# Patient Record
Sex: Female | Born: 1985 | Race: Black or African American | Hispanic: No | Marital: Married | Smoking: Never smoker
Health system: Southern US, Community
[De-identification: ages and names within clinical notes are randomized; demographics above are authoritative.]

## PROBLEM LIST (undated history)

## (undated) ENCOUNTER — Inpatient Hospital Stay (HOSPITAL_COMMUNITY): Payer: Self-pay

## (undated) DIAGNOSIS — K219 Gastro-esophageal reflux disease without esophagitis: Secondary | ICD-10-CM

## (undated) DIAGNOSIS — G43909 Migraine, unspecified, not intractable, without status migrainosus: Secondary | ICD-10-CM

## (undated) DIAGNOSIS — Z8489 Family history of other specified conditions: Secondary | ICD-10-CM

## (undated) DIAGNOSIS — D219 Benign neoplasm of connective and other soft tissue, unspecified: Secondary | ICD-10-CM

## (undated) DIAGNOSIS — R87629 Unspecified abnormal cytological findings in specimens from vagina: Secondary | ICD-10-CM

## (undated) DIAGNOSIS — L309 Dermatitis, unspecified: Secondary | ICD-10-CM

## (undated) DIAGNOSIS — F32A Depression, unspecified: Secondary | ICD-10-CM

## (undated) HISTORY — DX: Dermatitis, unspecified: L30.9

## (undated) HISTORY — DX: Depression, unspecified: F32.A

## (undated) HISTORY — DX: Unspecified abnormal cytological findings in specimens from vagina: R87.629

## (undated) HISTORY — PX: WISDOM TOOTH EXTRACTION: SHX21

---

## 2005-03-19 ENCOUNTER — Other Ambulatory Visit: Admission: RE | Admit: 2005-03-19 | Discharge: 2005-03-19 | Payer: Self-pay | Admitting: Family Medicine

## 2006-08-09 ENCOUNTER — Other Ambulatory Visit: Admission: RE | Admit: 2006-08-09 | Discharge: 2006-08-09 | Payer: Self-pay | Admitting: Family Medicine

## 2007-02-10 ENCOUNTER — Other Ambulatory Visit: Admission: RE | Admit: 2007-02-10 | Discharge: 2007-02-10 | Payer: Self-pay | Admitting: Family Medicine

## 2007-08-15 ENCOUNTER — Other Ambulatory Visit: Admission: RE | Admit: 2007-08-15 | Discharge: 2007-08-15 | Payer: Self-pay | Admitting: Family Medicine

## 2007-12-12 ENCOUNTER — Ambulatory Visit (HOSPITAL_COMMUNITY): Admission: RE | Admit: 2007-12-12 | Discharge: 2007-12-12 | Payer: Self-pay | Admitting: Oral Surgery

## 2008-08-16 ENCOUNTER — Other Ambulatory Visit: Admission: RE | Admit: 2008-08-16 | Discharge: 2008-08-16 | Payer: Self-pay | Admitting: Family Medicine

## 2009-02-26 ENCOUNTER — Other Ambulatory Visit: Admission: RE | Admit: 2009-02-26 | Discharge: 2009-02-26 | Payer: Self-pay | Admitting: Family Medicine

## 2009-10-31 ENCOUNTER — Other Ambulatory Visit: Admission: RE | Admit: 2009-10-31 | Discharge: 2009-10-31 | Payer: Self-pay | Admitting: Obstetrics and Gynecology

## 2010-04-30 ENCOUNTER — Other Ambulatory Visit: Admission: RE | Admit: 2010-04-30 | Discharge: 2010-04-30 | Payer: Self-pay | Admitting: Obstetrics and Gynecology

## 2010-10-05 ENCOUNTER — Encounter
Admission: RE | Admit: 2010-10-05 | Discharge: 2010-10-05 | Payer: Self-pay | Source: Home / Self Care | Attending: Family Medicine | Admitting: Family Medicine

## 2010-10-08 ENCOUNTER — Encounter
Admission: RE | Admit: 2010-10-08 | Discharge: 2010-10-08 | Payer: Self-pay | Source: Home / Self Care | Attending: Family Medicine | Admitting: Family Medicine

## 2011-03-05 ENCOUNTER — Other Ambulatory Visit: Payer: Self-pay | Admitting: Obstetrics and Gynecology

## 2011-03-05 ENCOUNTER — Other Ambulatory Visit (HOSPITAL_COMMUNITY)
Admission: RE | Admit: 2011-03-05 | Discharge: 2011-03-05 | Disposition: A | Discharge status: Home / Self Care | Payer: Self-pay | Source: Ambulatory Visit | Attending: Obstetrics and Gynecology | Admitting: Obstetrics and Gynecology

## 2011-03-05 DIAGNOSIS — Z01419 Encounter for gynecological examination (general) (routine) without abnormal findings: Secondary | ICD-10-CM | POA: Insufficient documentation

## 2012-03-08 ENCOUNTER — Other Ambulatory Visit: Payer: Self-pay | Admitting: Obstetrics and Gynecology

## 2012-03-08 ENCOUNTER — Other Ambulatory Visit (HOSPITAL_COMMUNITY)
Admission: RE | Admit: 2012-03-08 | Discharge: 2012-03-08 | Disposition: A | Discharge status: Home / Self Care | Payer: 59 | Source: Ambulatory Visit | Attending: Obstetrics and Gynecology | Admitting: Obstetrics and Gynecology

## 2012-03-08 DIAGNOSIS — Z01419 Encounter for gynecological examination (general) (routine) without abnormal findings: Secondary | ICD-10-CM | POA: Insufficient documentation

## 2012-03-08 DIAGNOSIS — Z113 Encounter for screening for infections with a predominantly sexual mode of transmission: Secondary | ICD-10-CM | POA: Insufficient documentation

## 2013-03-14 ENCOUNTER — Other Ambulatory Visit (HOSPITAL_COMMUNITY)
Admission: RE | Admit: 2013-03-14 | Discharge: 2013-03-14 | Disposition: A | Discharge status: Home / Self Care | Payer: 59 | Source: Ambulatory Visit | Attending: Obstetrics and Gynecology | Admitting: Obstetrics and Gynecology

## 2013-03-14 ENCOUNTER — Other Ambulatory Visit: Payer: Self-pay | Admitting: Obstetrics and Gynecology

## 2013-03-14 DIAGNOSIS — Z01419 Encounter for gynecological examination (general) (routine) without abnormal findings: Secondary | ICD-10-CM | POA: Insufficient documentation

## 2013-06-28 ENCOUNTER — Emergency Department (HOSPITAL_COMMUNITY)
Admission: EM | Admit: 2013-06-28 | Discharge: 2013-06-28 | Disposition: A | Discharge status: Home / Self Care | Payer: 59 | Attending: Emergency Medicine | Admitting: Emergency Medicine

## 2013-06-28 ENCOUNTER — Encounter (HOSPITAL_COMMUNITY): Payer: Self-pay

## 2013-06-28 DIAGNOSIS — G43909 Migraine, unspecified, not intractable, without status migrainosus: Secondary | ICD-10-CM

## 2013-06-28 DIAGNOSIS — IMO0002 Reserved for concepts with insufficient information to code with codable children: Secondary | ICD-10-CM | POA: Insufficient documentation

## 2013-06-28 DIAGNOSIS — R209 Unspecified disturbances of skin sensation: Secondary | ICD-10-CM | POA: Insufficient documentation

## 2013-06-28 DIAGNOSIS — Z3202 Encounter for pregnancy test, result negative: Secondary | ICD-10-CM | POA: Insufficient documentation

## 2013-06-28 DIAGNOSIS — Z79899 Other long term (current) drug therapy: Secondary | ICD-10-CM | POA: Insufficient documentation

## 2013-06-28 HISTORY — DX: Migraine, unspecified, not intractable, without status migrainosus: G43.909

## 2013-06-28 MED ORDER — METOCLOPRAMIDE HCL 5 MG/ML IJ SOLN
10.0000 mg | Freq: Once | INTRAMUSCULAR | Status: AC
  Administered 2013-06-28: 10 mg via INTRAVENOUS
  Filled 2013-06-28: qty 2

## 2013-06-28 MED ORDER — DIPHENHYDRAMINE HCL 50 MG/ML IJ SOLN
25.0000 mg | Freq: Once | INTRAMUSCULAR | Status: AC
  Administered 2013-06-28: 25 mg via INTRAVENOUS
  Filled 2013-06-28: qty 1

## 2013-06-28 MED ORDER — DEXAMETHASONE SODIUM PHOSPHATE 10 MG/ML IJ SOLN
10.0000 mg | Freq: Once | INTRAMUSCULAR | Status: AC
  Administered 2013-06-28: 10 mg via INTRAVENOUS
  Filled 2013-06-28: qty 1

## 2013-06-28 MED ORDER — SODIUM CHLORIDE 0.9 % IV BOLUS (SEPSIS)
1000.0000 mL | Freq: Once | INTRAVENOUS | Status: AC
  Administered 2013-06-28: 1000 mL via INTRAVENOUS

## 2013-06-28 NOTE — ED Provider Notes (Signed)
CSN: [id number]     Arrival date & time 06/28/13  0912 History   First MD Initiated Contact with Patient 06/28/13 1056     Chief Complaint  Patient presents with  . Migraine   (Consider location/radiation/quality/duration/timing/severity/associated sxs/prior Treatment) Patient is a 27 y.o. female presenting with migraines.  Migraine This is a new problem. The current episode started more than 1 week ago (3 days). The problem occurs constantly. The problem has been gradually worsening. Associated symptoms include headaches. Pertinent negatives include no chest pain, no abdominal pain and no shortness of breath. Exacerbated by: light. Nothing relieves the symptoms. Treatments tried: replex, flexeril, topamax. The treatment provided no relief.    Past Medical History  Diagnosis Date  . Migraine    History reviewed. No pertinent past surgical history. History reviewed. No pertinent family history. History  Substance Use Topics  . Smoking status: Never Smoker   . Smokeless tobacco: Never Used  . Alcohol Use: No   OB History   Grav Para Term Preterm Abortions TAB SAB Ect Mult Living                 Review of Systems  Constitutional: Negative for fever, chills, diaphoresis, activity change, appetite change and fatigue.  HENT: Negative for congestion, sore throat, facial swelling, rhinorrhea, neck pain and neck stiffness.   Eyes: Negative for photophobia and discharge.  Respiratory: Negative for cough, chest tightness and shortness of breath.   Cardiovascular: Negative for chest pain, palpitations and leg swelling.  Gastrointestinal: Negative for nausea, vomiting, abdominal pain and diarrhea.  Endocrine: Negative for polydipsia and polyuria.  Genitourinary: Negative for dysuria, frequency, difficulty urinating and pelvic pain.  Musculoskeletal: Negative for back pain and arthralgias.  Skin: Negative for color change and wound.  Allergic/Immunologic: Negative for immunocompromised  state.  Neurological: Positive for headaches. Negative for facial asymmetry, weakness and numbness.  Hematological: Does not bruise/bleed easily.  Psychiatric/Behavioral: Negative for confusion and agitation.    Allergies  Review of patient's allergies indicates no known allergies.  Home Medications   Current Outpatient Rx  Name  Route  Sig  Dispense  Refill  . cyclobenzaprine (FLEXERIL) 10 MG tablet   Oral   Take 10 mg by mouth 3 (three) times daily as needed (migraines).         . eletriptan (RELPAX) 40 MG tablet   Oral   Take 40 mg by mouth as needed for migraine. One tablet by mouth at onset of headache. May repeat in 2 hours if headache persists or recurs.         . norgestimate-ethinyl estradiol (ORTHO-CYCLEN,SPRINTEC,PREVIFEM) 0.25-35 MG-MCG tablet   Oral   Take 1 tablet by mouth daily.         Marland Kitchen topiramate (TOPAMAX) 50 MG tablet   Oral   Take 100 mg by mouth at bedtime.         . triamcinolone ointment (KENALOG) 0.1 %   Topical   Apply 1 application topically 2 (two) times daily as needed (eczema).          BP 125/83  Pulse 97  Temp(Src) 98.4 F (36.9 C) (Oral)  Resp 16  Ht 5\' 1"  (1.549 m)  Wt 144 lb 6 oz (65.488 kg)  BMI 27.29 kg/m2  SpO2 100%  LMP 03/28/2013 Physical Exam  Constitutional: She is oriented to person, place, and time. She appears well-developed and well-nourished. No distress.  HENT:  Head: Normocephalic and atraumatic.  Mouth/Throat: No oropharyngeal exudate.  Eyes: Pupils  are equal, round, and reactive to light.  Neck: Normal range of motion. Neck supple.  Cardiovascular: Normal rate, regular rhythm and normal heart sounds.  Exam reveals no gallop and no friction rub.   No murmur heard. Pulmonary/Chest: Effort normal and breath sounds normal. No respiratory distress. She has no wheezes. She has no rales.  Abdominal: Soft. Bowel sounds are normal. She exhibits no distension and no mass. There is no tenderness. There is no  rebound and no guarding.  Musculoskeletal: Normal range of motion. She exhibits no edema and no tenderness.  Neurological: She is alert and oriented to person, place, and time. She has normal strength. She displays no tremor. No cranial nerve deficit or sensory deficit. She exhibits normal muscle tone. She displays a negative Romberg sign. Coordination and gait normal. GCS eye subscore is 4. GCS verbal subscore is 5. GCS motor subscore is 6.  Skin: Skin is warm and dry.  Psychiatric: She has a normal mood and affect.    ED Course  Procedures (including critical care time) Labs Review Labs Reviewed  POCT PREGNANCY, URINE   Imaging Review No results found.  MDM   1. Migraine    Pt is a 27 y.o. female with Pmhx as above who presents with constant, L sided migraine w/ assoc nausea, blurry vision, photophobia and paresthesias L hand.  Symptoms similar to prior symptoms, has tired home meds w/o relief.  VSS, tp in NDA on PE, no focal neuro findings.  Doubt meningitis, SAH.  Migraine cocktail given which improved symptoms.  I feel tp safe for d/c.  Return precautions given for new or worsening symptoms including worsening h/a, fever, focal neuro symptoms.   1. Migraine         Shanna Cisco, MD 06/28/13 1300

## 2013-06-28 NOTE — ED Notes (Signed)
Patient reports having a migraine x 3 days. Patient c/o slight photophobia and intense sound increases pain. Patient c/o blurred vision when she woke this AM. Patient states she has taken the meds prescribed for her migraines with no relief.

## 2013-06-28 NOTE — Progress Notes (Signed)
[facility] ED CM noted pt with coverage but no pcp listed Spoke with pt who confirms no pcp States previously saw a pcp at [facility] but the MD retired in august 2014  [facility] ED CM spoke with pt on how to obtain an in network pcp with insurance coverage via the customer service number or web site  CM encouraged pt and discussed pt's responsibility to verify with pt's insurance carrier that any recommended medical provider offered by any emergency room or a hospital provider is within the carrier's network. The pt voiced understanding

## 2013-06-28 NOTE — ED Notes (Signed)
Pharmacist at bedside.

## 2013-11-13 ENCOUNTER — Other Ambulatory Visit: Payer: Self-pay | Admitting: Family Medicine

## 2013-11-13 DIAGNOSIS — R109 Unspecified abdominal pain: Secondary | ICD-10-CM

## 2013-11-16 ENCOUNTER — Ambulatory Visit
Admission: RE | Admit: 2013-11-16 | Discharge: 2013-11-16 | Disposition: A | Discharge status: Home / Self Care | Payer: 59 | Source: Ambulatory Visit | Attending: Family Medicine | Admitting: Family Medicine

## 2013-11-16 DIAGNOSIS — R109 Unspecified abdominal pain: Secondary | ICD-10-CM

## 2013-11-20 ENCOUNTER — Encounter (INDEPENDENT_AMBULATORY_CARE_PROVIDER_SITE_OTHER): Payer: Self-pay | Admitting: Surgery

## 2013-11-27 ENCOUNTER — Telehealth (INDEPENDENT_AMBULATORY_CARE_PROVIDER_SITE_OTHER): Payer: Self-pay | Admitting: Surgery

## 2013-11-27 ENCOUNTER — Encounter (INDEPENDENT_AMBULATORY_CARE_PROVIDER_SITE_OTHER): Payer: Self-pay | Admitting: Surgery

## 2013-11-27 ENCOUNTER — Ambulatory Visit (INDEPENDENT_AMBULATORY_CARE_PROVIDER_SITE_OTHER): Payer: 59 | Admitting: Surgery

## 2013-11-27 VITALS — BP 122/79 | HR 82 | Temp 98.2°F | Resp 12 | Ht 61.0 in | Wt 138.2 lb

## 2013-11-27 DIAGNOSIS — K8010 Calculus of gallbladder with chronic cholecystitis without obstruction: Secondary | ICD-10-CM

## 2013-11-27 NOTE — Progress Notes (Signed)
Patient ID: Karen Berg, female   DOB: 23-Jul-1986, 28 y.o.   MRN: [id number]  Chief Complaint  Patient presents with  . Abdominal Pain    gallstones    HPI Karen Berg is a 28 y.o. female.  Referred by Dr. Maurice Small for symptomatic gallbladder disease  Abdominal Pain Associated symptoms: no chest pain, no chills, no constipation, no cough, no diarrhea, no fever, no hematuria, no nausea, no sore throat, no vaginal bleeding and no vomiting    This is a healthy 28 year old female who presents with 3 recent episodes of severe epigastric abdominal pain that radiated through to her back. These episodes are associated with nausea but no vomiting. She denies any abdominal bloating or diarrhea. Her symptoms seem to be related to eating. She underwent a workup including blood work and an ultrasound. Ultrasound showed gallstones. Liver function test are normal.   Past Medical History  Diagnosis Date  . Migraine   . Eczema     History reviewed. No pertinent past surgical history.  History reviewed. No pertinent family history.  Social History History  Substance Use Topics  . Smoking status: Never Smoker   . Smokeless tobacco: Never Used  . Alcohol Use: Yes     Comment: rarely    No Known Allergies  Current Outpatient Prescriptions  Medication Sig Dispense Refill  . cyclobenzaprine (FLEXERIL) 10 MG tablet Take 10 mg by mouth 3 (three) times daily as needed (migraines).      . eletriptan (RELPAX) 40 MG tablet Take 40 mg by mouth as needed for migraine. One tablet by mouth at onset of headache. May repeat in 2 hours if headache persists or recurs.      Marland Kitchen ibuprofen (ADVIL,MOTRIN) 800 MG tablet Take 800 mg by mouth every 8 (eight) hours as needed.      . norgestimate-ethinyl estradiol (ORTHO-CYCLEN,SPRINTEC,PREVIFEM) 0.25-35 MG-MCG tablet Take 1 tablet by mouth daily.      Marland Kitchen topiramate (TOPAMAX) 50 MG tablet Take 100 mg by mouth at bedtime.      . triamcinolone ointment (KENALOG)  0.1 % Apply 1 application topically 2 (two) times daily as needed (eczema).       No current facility-administered medications for this visit.    Review of Systems Review of Systems  Constitutional: Negative for fever, chills and unexpected weight change.  HENT: Negative for congestion, hearing loss, sore throat, trouble swallowing and voice change.   Eyes: Negative for visual disturbance.  Respiratory: Negative for cough and wheezing.   Cardiovascular: Negative for chest pain, palpitations and leg swelling.  Gastrointestinal: Positive for abdominal pain. Negative for nausea, vomiting, diarrhea, constipation, blood in stool, abdominal distention and anal bleeding.  Genitourinary: Negative for hematuria, vaginal bleeding and difficulty urinating.  Musculoskeletal: Negative for arthralgias.  Skin: Negative for rash and wound.  Neurological: Negative for seizures, syncope and headaches.  Hematological: Negative for adenopathy. Does not bruise/bleed easily.  Psychiatric/Behavioral: Negative for confusion.    Blood pressure 122/79, pulse 82, temperature 98.2 F (36.8 C), temperature source Temporal, resp. rate 12, height 5\' 1"  (1.549 m), weight 138 lb 3.2 oz (62.687 kg).  Physical Exam Physical Exam WDWN in NAD HEENT:  EOMI, sclera anicteric Neck:  No masses, no thyromegaly Lungs:  CTA bilaterally; normal respiratory effort CV:  Regular rate and rhythm; no murmurs Abd:  +bowel sounds, soft, mild epigastric/ RUQ tenderness, no masses Ext:  Well-perfused; no edema Skin:  Warm, dry; no sign of jaundice  Data Reviewed US Abdomen Complete  11/16/2013  CLINICAL DATA:  Abdominal pain  EXAM: ULTRASOUND ABDOMEN COMPLETE  COMPARISON:  None.  FINDINGS: Gallbladder:  Small echogenic stones, mobile. No wall thickening. No pericholecystic fluid.  Common bile duct:  Diameter: 4.1 mm.  No duct stone is seen.  Liver:  No focal lesion identified. Within normal limits in parenchymal echogenicity.  IVC:   No abnormality visualized.  Pancreas:  Visualized portion unremarkable.  Spleen:  Size and appearance within normal limits.  Right Kidney:  Length: 10.7 cm. Echogenicity within normal limits. No mass or hydronephrosis visualized.  Left Kidney:  Length: 10.2 cm. Echogenicity within normal limits. No mass or hydronephrosis visualized.  Abdominal aorta:  No aneurysm visualized.  Other findings:  None.  IMPRESSION: Cholelithiasis.  No evidence acute cholecystitis.  No other abnormalities.   Electronically Signed   By: Lajean Manes M.D.   On: 11/16/2013 13:28      Assessment    Chronic calculus cholecystitis.     Plan    Laparoscopic cholecystectomy with intraoperative cholangiogram.  The surgical procedure has been discussed with the patient.  Potential risks, benefits, alternative treatments, and expected outcomes have been explained.  All of the patient's questions at this time have been answered.  The likelihood of reaching the patient's treatment goal is good.  The patient understand the proposed surgical procedure and wishes to proceed.    [name]. Georgette Dover, MD, [facility] Surgery  General/ Trauma Surgery  11/27/2013 2:18 PM        Parley Pidcock K. 11/27/2013, 2:14 PM

## 2013-11-27 NOTE — Telephone Encounter (Signed)
Pt made aware of [facility] financial obligation will call back to schedule  °

## 2014-01-21 ENCOUNTER — Encounter (HOSPITAL_COMMUNITY): Payer: Self-pay | Admitting: Pharmacy Technician

## 2014-01-28 ENCOUNTER — Encounter (HOSPITAL_COMMUNITY): Payer: Self-pay

## 2014-01-28 ENCOUNTER — Encounter (HOSPITAL_COMMUNITY)
Admission: RE | Admit: 2014-01-28 | Discharge: 2014-01-28 | Disposition: A | Discharge status: Home / Self Care | Payer: 59 | Source: Ambulatory Visit | Attending: Surgery | Admitting: Surgery

## 2014-01-28 DIAGNOSIS — Z01812 Encounter for preprocedural laboratory examination: Secondary | ICD-10-CM | POA: Insufficient documentation

## 2014-01-28 HISTORY — DX: Gastro-esophageal reflux disease without esophagitis: K21.9

## 2014-01-28 HISTORY — DX: Family history of other specified conditions: Z84.89

## 2014-01-28 LAB — BASIC METABOLIC PANEL
BUN: 10 mg/dL (ref 6–23)
CO2: 18 meq/L — AB (ref 19–32)
CREATININE: 0.92 mg/dL (ref 0.50–1.10)
Calcium: 9.1 mg/dL (ref 8.4–10.5)
Chloride: 106 mEq/L (ref 96–112)
GFR calc Af Amer: >90 mL/min (ref >90)
GFR calc non Af Amer: 83 mL/min — ABNORMAL LOW (ref >90)
Glucose, Bld: 87 mg/dL (ref 70–99)
Potassium: 3.8 mEq/L (ref 3.7–5.3)
SODIUM: 139 meq/L (ref 137–147)

## 2014-01-28 LAB — CBC
HCT: 34.1 % — ABNORMAL LOW (ref 36.0–46.0)
Hemoglobin: 11.9 g/dL — ABNORMAL LOW (ref 12.0–15.0)
MCH: 31.6 pg (ref 26.0–34.0)
MCHC: 34.9 g/dL (ref 30.0–36.0)
MCV: 90.5 fL (ref 78.0–100.0)
PLATELETS: 280 10*3/uL (ref 150–400)
RBC: 3.77 MIL/uL — AB (ref 3.87–5.11)
RDW: 12.8 % (ref 11.5–15.5)
WBC: 6.2 10*3/uL (ref 4.0–10.5)

## 2014-01-28 LAB — HCG, SERUM, QUALITATIVE: PREG SERUM: NEGATIVE

## 2014-01-28 NOTE — Pre-Procedure Instructions (Addendum)
Karen Berg  01/28/2014   Your procedure is scheduled on: Tuesday, February 05, 2014 at 7:30 AM  Report to [facility] (use Main Entrance "A'') at  5:30 AM.  Call this number if you have problems the morning of surgery: [phone]   Remember:   Do not eat food or drink liquids after midnight, Monday, February 04, 2014   Take these medicines the morning of surgery with A SIP OF WATER:  omeprazole (PRILOSEC)  If needed: SUMAtriptan (IMITREX) 20 MG/ACT nasal spray for migraine or headache.  Stop taking Aspirin and herbal medications. Do not take any NSAIDs ie: Ibuprofen, Advil, Naproxen or any medication containing Aspirin (flurbiprofen (ANSAID) 100 MG tablet). Stop 5 days prior to procedure, Thursday, 01/31/14   Do not wear jewelry, make-up or nail polish.  Do not wear lotions, powders, or perfumes. You may  NOT wear deodorant.  Do not shave 48 hours prior to surgery.  Do not bring valuables to the hospital.  [facility] is not responsible for any belongings or valuables.               Contacts, dentures or bridgework may not be worn into surgery.  Leave suitcase in the car. After surgery it may be brought to your room.  For patients admitted to the hospital, discharge time is determined by your treatment team.               Patients discharged the day of surgery will not be allowed to drive home.  Name and phone number of your driver:   Special Instructions:  Special Instructions:Special Instructions: [facility] - Preparing for Surgery  Before surgery, you can play an important role.  Because skin is not sterile, your skin needs to be as free of germs as possible.  You can reduce the number of germs on you skin by washing with CHG (chlorahexidine gluconate) soap before surgery.  CHG is an antiseptic cleaner which kills germs and bonds with the skin to continue killing germs even after washing.  Please DO NOT use if you have an allergy to CHG or antibacterial soaps.  If your  skin becomes reddened/irritated stop using the CHG and inform your nurse when you arrive at Short Stay.  Do not shave (including legs and underarms) for at least 48 hours prior to the first CHG shower.  You may shave your face.  Please follow these instructions carefully:   1.  Shower with CHG Soap the night before surgery and the morning of Surgery.  2.  If you choose to wash your hair, wash your hair first as usual with your normal shampoo.  3.  After you shampoo, rinse your hair and body thoroughly to remove the Shampoo.  4.  Use CHG as you would any other liquid soap.  You can apply chg directly  to the skin and wash gently with scrungie or a clean washcloth.  5.  Apply the CHG Soap to your body ONLY FROM THE NECK DOWN.  Do not use on open wounds or open sores.  Avoid contact with your eyes, ears, mouth and genitals (private parts).  Wash genitals (private parts) with your normal soap.  6.  Wash thoroughly, paying special attention to the area where your surgery will be performed.  7.  Thoroughly rinse your body with warm water from the neck down.  8.  DO NOT shower/wash with your normal soap after using and rinsing off the CHG Soap.  9.  Fraser Din  yourself dry with a clean towel.            10.  Wear clean pajamas.            11.  Place clean sheets on your bed the night of your first shower and do not sleep with pets.  Day of Surgery  Do not apply any lotions/deodorants the morning of surgery.  Please wear clean clothes to the hospital/surgery center.   Please read over the following fact sheets that you were given: Pain Booklet, Coughing and Deep Breathing and Surgical Site Infection Prevention

## 2014-01-30 NOTE — Progress Notes (Signed)
Message left for medical records at Dr Oran Rein to sent over last office visit and most recent EKG.

## 2014-02-04 MED ORDER — CEFAZOLIN SODIUM-DEXTROSE 2-3 GM-% IV SOLR
2.0000 g | INTRAVENOUS | Status: AC
  Administered 2014-02-05: 2 g via INTRAVENOUS
  Filled 2014-02-04: qty 50

## 2014-02-05 ENCOUNTER — Ambulatory Visit (HOSPITAL_COMMUNITY)
Admission: RE | Admit: 2014-02-05 | Discharge: 2014-02-05 | Disposition: A | Discharge status: Home / Self Care | Payer: 59 | Source: Ambulatory Visit | Attending: Surgery | Admitting: Surgery

## 2014-02-05 ENCOUNTER — Encounter (HOSPITAL_COMMUNITY): Payer: Self-pay | Admitting: Anesthesiology

## 2014-02-05 ENCOUNTER — Ambulatory Visit (HOSPITAL_COMMUNITY): Payer: 59 | Admitting: Anesthesiology

## 2014-02-05 ENCOUNTER — Ambulatory Visit (HOSPITAL_COMMUNITY): Payer: 59

## 2014-02-05 ENCOUNTER — Encounter (HOSPITAL_COMMUNITY)
Admission: RE | Disposition: A | Discharge status: Home / Self Care | Payer: Self-pay | Source: Ambulatory Visit | Attending: Surgery

## 2014-02-05 ENCOUNTER — Encounter (HOSPITAL_COMMUNITY): Payer: 59 | Admitting: Anesthesiology

## 2014-02-05 DIAGNOSIS — L259 Unspecified contact dermatitis, unspecified cause: Secondary | ICD-10-CM | POA: Insufficient documentation

## 2014-02-05 DIAGNOSIS — G43909 Migraine, unspecified, not intractable, without status migrainosus: Secondary | ICD-10-CM | POA: Insufficient documentation

## 2014-02-05 DIAGNOSIS — K8010 Calculus of gallbladder with chronic cholecystitis without obstruction: Secondary | ICD-10-CM

## 2014-02-05 DIAGNOSIS — Z79899 Other long term (current) drug therapy: Secondary | ICD-10-CM | POA: Insufficient documentation

## 2014-02-05 DIAGNOSIS — K824 Cholesterolosis of gallbladder: Secondary | ICD-10-CM | POA: Insufficient documentation

## 2014-02-05 DIAGNOSIS — K219 Gastro-esophageal reflux disease without esophagitis: Secondary | ICD-10-CM | POA: Insufficient documentation

## 2014-02-05 HISTORY — PX: CHOLECYSTECTOMY: SHX55

## 2014-02-05 SURGERY — LAPAROSCOPIC CHOLECYSTECTOMY WITH INTRAOPERATIVE CHOLANGIOGRAM
Anesthesia: General | Site: Abdomen

## 2014-02-05 MED ORDER — ROCURONIUM BROMIDE 100 MG/10ML IV SOLN
INTRAVENOUS | Status: DC | PRN
  Administered 2014-02-05: 20 mg via INTRAVENOUS

## 2014-02-05 MED ORDER — MIDAZOLAM HCL 5 MG/5ML IJ SOLN
INTRAMUSCULAR | Status: DC | PRN
  Administered 2014-02-05: 2 mg via INTRAVENOUS

## 2014-02-05 MED ORDER — OXYCODONE-ACETAMINOPHEN 5-325 MG PO TABS: 1.0000 | ORAL_TABLET | ORAL | Status: DC | PRN

## 2014-02-05 MED ORDER — HEMOSTATIC AGENTS (NO CHARGE) OPTIME
TOPICAL | Status: DC | PRN
  Administered 2014-02-05: 1

## 2014-02-05 MED ORDER — LIDOCAINE HCL 4 % MT SOLN
OROMUCOSAL | Status: DC | PRN
  Administered 2014-02-05: 4 mL via TOPICAL

## 2014-02-05 MED ORDER — HYDROMORPHONE HCL PF 1 MG/ML IJ SOLN
0.2500 mg | INTRAMUSCULAR | Status: DC | PRN
  Administered 2014-02-05 (×2): 0.25 mg via INTRAVENOUS
  Administered 2014-02-05: 0.5 mg via INTRAVENOUS

## 2014-02-05 MED ORDER — LIDOCAINE HCL (CARDIAC) 20 MG/ML IV SOLN
INTRAVENOUS | Status: DC | PRN
  Administered 2014-02-05: 60 mg via INTRAVENOUS

## 2014-02-05 MED ORDER — FENTANYL CITRATE 0.05 MG/ML IJ SOLN
INTRAMUSCULAR | Status: AC
  Filled 2014-02-05: qty 5

## 2014-02-05 MED ORDER — LACTATED RINGERS IV SOLN
INTRAVENOUS | Status: DC | PRN
  Administered 2014-02-05: 07:00:00 via INTRAVENOUS

## 2014-02-05 MED ORDER — GLYCOPYRROLATE 0.2 MG/ML IJ SOLN
INTRAMUSCULAR | Status: DC | PRN
  Administered 2014-02-05: 0.4 mg via INTRAVENOUS

## 2014-02-05 MED ORDER — LIDOCAINE HCL (CARDIAC) 20 MG/ML IV SOLN
INTRAVENOUS | Status: AC
  Filled 2014-02-05: qty 5

## 2014-02-05 MED ORDER — BUPIVACAINE-EPINEPHRINE 0.25% -1:200000 IJ SOLN
INTRAMUSCULAR | Status: DC | PRN
  Administered 2014-02-05: 12 mL

## 2014-02-05 MED ORDER — 0.9 % SODIUM CHLORIDE (POUR BTL) OPTIME
TOPICAL | Status: DC | PRN
  Administered 2014-02-05: 1000 mL

## 2014-02-05 MED ORDER — NEOSTIGMINE METHYLSULFATE 1 MG/ML IJ SOLN
INTRAMUSCULAR | Status: DC | PRN
  Administered 2014-02-05: 3 mg via INTRAVENOUS

## 2014-02-05 MED ORDER — MIDAZOLAM HCL 2 MG/2ML IJ SOLN
INTRAMUSCULAR | Status: AC
  Filled 2014-02-05: qty 2

## 2014-02-05 MED ORDER — SODIUM CHLORIDE 0.9 % IR SOLN
Status: DC | PRN
  Administered 2014-02-05: 1000 mL

## 2014-02-05 MED ORDER — ROCURONIUM BROMIDE 50 MG/5ML IV SOLN
INTRAVENOUS | Status: AC
  Filled 2014-02-05: qty 1

## 2014-02-05 MED ORDER — IOHEXOL 300 MG/ML  SOLN
INTRAMUSCULAR | Status: DC | PRN
  Administered 2014-02-05: 08:00:00

## 2014-02-05 MED ORDER — HYDROMORPHONE HCL PF 1 MG/ML IJ SOLN
INTRAMUSCULAR | Status: AC
  Filled 2014-02-05: qty 1

## 2014-02-05 MED ORDER — DEXAMETHASONE SODIUM PHOSPHATE 10 MG/ML IJ SOLN
INTRAMUSCULAR | Status: DC | PRN
  Administered 2014-02-05: 8 mg via INTRAVENOUS

## 2014-02-05 MED ORDER — MORPHINE SULFATE 2 MG/ML IJ SOLN: 2.0000 mg | INTRAMUSCULAR | Status: DC | PRN

## 2014-02-05 MED ORDER — ONDANSETRON HCL 4 MG/2ML IJ SOLN: 4.0000 mg | INTRAMUSCULAR | Status: DC | PRN

## 2014-02-05 MED ORDER — BUPIVACAINE-EPINEPHRINE (PF) 0.25% -1:200000 IJ SOLN
INTRAMUSCULAR | Status: AC
  Filled 2014-02-05: qty 30

## 2014-02-05 MED ORDER — PHENYLEPHRINE HCL 10 MG/ML IJ SOLN
INTRAMUSCULAR | Status: DC | PRN
  Administered 2014-02-05 (×2): 40 ug via INTRAVENOUS

## 2014-02-05 MED ORDER — EPHEDRINE SULFATE 50 MG/ML IJ SOLN
INTRAMUSCULAR | Status: AC
  Filled 2014-02-05: qty 1

## 2014-02-05 MED ORDER — ONDANSETRON HCL 4 MG/2ML IJ SOLN
INTRAMUSCULAR | Status: DC | PRN
  Administered 2014-02-05: 4 mg via INTRAVENOUS

## 2014-02-05 MED ORDER — PHENYLEPHRINE 40 MCG/ML (10ML) SYRINGE FOR IV PUSH (FOR BLOOD PRESSURE SUPPORT)
PREFILLED_SYRINGE | INTRAVENOUS | Status: AC
  Filled 2014-02-05: qty 10

## 2014-02-05 MED ORDER — CHLORHEXIDINE GLUCONATE 4 % EX LIQD
1.0000 "application " | Freq: Once | CUTANEOUS | Status: DC
  Filled 2014-02-05: qty 15

## 2014-02-05 MED ORDER — PROPOFOL 10 MG/ML IV BOLUS
INTRAVENOUS | Status: DC | PRN
  Administered 2014-02-05: 200 mg via INTRAVENOUS

## 2014-02-05 MED ORDER — EPHEDRINE SULFATE 50 MG/ML IJ SOLN
INTRAMUSCULAR | Status: DC | PRN
  Administered 2014-02-05: 5 mg via INTRAVENOUS

## 2014-02-05 MED ORDER — HYDROMORPHONE HCL PF 1 MG/ML IJ SOLN
INTRAMUSCULAR | Status: AC
  Filled 2014-02-05: qty 2

## 2014-02-05 MED ORDER — PROPOFOL 10 MG/ML IV BOLUS
INTRAVENOUS | Status: AC
  Filled 2014-02-05: qty 20

## 2014-02-05 MED ORDER — OXYCODONE-ACETAMINOPHEN 5-325 MG PO TABS
1.0000 | ORAL_TABLET | ORAL | Status: DC | PRN
  Administered 2014-02-05: 1 via ORAL
  Filled 2014-02-05: qty 1

## 2014-02-05 MED ORDER — FENTANYL CITRATE 0.05 MG/ML IJ SOLN
INTRAMUSCULAR | Status: DC | PRN
  Administered 2014-02-05 (×2): 50 ug via INTRAVENOUS
  Administered 2014-02-05: 100 ug via INTRAVENOUS

## 2014-02-05 MED ORDER — ONDANSETRON HCL 4 MG/2ML IJ SOLN: 4.0000 mg | Freq: Once | INTRAMUSCULAR | Status: DC | PRN

## 2014-02-05 MED ORDER — SUCCINYLCHOLINE CHLORIDE 20 MG/ML IJ SOLN
INTRAMUSCULAR | Status: AC
  Filled 2014-02-05: qty 1

## 2014-02-05 SURGICAL SUPPLY — 49 items
APL SKNCLS STERI-STRIP NONHPOA (GAUZE/BANDAGES/DRESSINGS) ×1
APPLIER CLIP ROT 10 11.4 M/L (STAPLE) ×3
APR CLP MED LRG 11.4X10 (STAPLE) ×1
BAG SPEC RTRVL LRG 6X4 10 (ENDOMECHANICALS) ×1
BENZOIN TINCTURE PRP APPL 2/3 (GAUZE/BANDAGES/DRESSINGS) ×3 IMPLANT
BLADE SURG ROTATE 9660 (MISCELLANEOUS) ×2 IMPLANT
CANISTER SUCTION 2500CC (MISCELLANEOUS) ×3 IMPLANT
CHLORAPREP W/TINT 26ML (MISCELLANEOUS) ×3 IMPLANT
CLIP APPLIE ROT 10 11.4 M/L (STAPLE) ×1 IMPLANT
COVER MAYO STAND STRL (DRAPES) ×3 IMPLANT
COVER SURGICAL LIGHT HANDLE (MISCELLANEOUS) ×3 IMPLANT
DRAPE C-ARM 42X72 X-RAY (DRAPES) ×3 IMPLANT
DRAPE UTILITY 15X26 W/TAPE STR (DRAPE) ×6 IMPLANT
DRSG TEGADERM 2-3/8X2-3/4 SM (GAUZE/BANDAGES/DRESSINGS) ×9 IMPLANT
DRSG TEGADERM 4X4.75 (GAUZE/BANDAGES/DRESSINGS) ×3 IMPLANT
ELECT REM PT RETURN 9FT ADLT (ELECTROSURGICAL) ×3
ELECTRODE REM PT RTRN 9FT ADLT (ELECTROSURGICAL) ×1 IMPLANT
FILTER SMOKE EVAC LAPAROSHD (FILTER) ×3 IMPLANT
GAUZE SPONGE 2X2 8PLY STRL LF (GAUZE/BANDAGES/DRESSINGS) ×1 IMPLANT
GLOVE BIO SURGEON STRL SZ7 (GLOVE) ×5 IMPLANT
GLOVE BIO SURGEON STRL SZ7.5 (GLOVE) ×4 IMPLANT
GLOVE BIOGEL PI IND STRL 7.0 (GLOVE) IMPLANT
GLOVE BIOGEL PI IND STRL 7.5 (GLOVE) ×1 IMPLANT
GLOVE BIOGEL PI IND STRL 8 (GLOVE) IMPLANT
GLOVE BIOGEL PI INDICATOR 7.0 (GLOVE) ×2
GLOVE BIOGEL PI INDICATOR 7.5 (GLOVE) ×4
GLOVE BIOGEL PI INDICATOR 8 (GLOVE) ×2
GLOVE ECLIPSE 8.0 STRL XLNG CF (GLOVE) ×2 IMPLANT
GOWN STRL REUS W/ TWL LRG LVL3 (GOWN DISPOSABLE) ×4 IMPLANT
GOWN STRL REUS W/TWL LRG LVL3 (GOWN DISPOSABLE) ×12
HEMOSTAT SNOW SURGICEL 2X4 (HEMOSTASIS) ×2 IMPLANT
KIT BASIN OR (CUSTOM PROCEDURE TRAY) ×3 IMPLANT
KIT ROOM TURNOVER OR (KITS) ×3 IMPLANT
NS IRRIG 1000ML POUR BTL (IV SOLUTION) ×3 IMPLANT
PAD ARMBOARD 7.5X6 YLW CONV (MISCELLANEOUS) ×3 IMPLANT
POUCH SPECIMEN RETRIEVAL 10MM (ENDOMECHANICALS) ×3 IMPLANT
SCISSORS LAP 5X35 DISP (ENDOMECHANICALS) ×3 IMPLANT
SET CHOLANGIOGRAPH 5 50 .035 (SET/KITS/TRAYS/PACK) ×3 IMPLANT
SET IRRIG TUBING LAPAROSCOPIC (IRRIGATION / IRRIGATOR) ×3 IMPLANT
SLEEVE ENDOPATH XCEL 5M (ENDOMECHANICALS) ×3 IMPLANT
SPECIMEN JAR SMALL (MISCELLANEOUS) ×3 IMPLANT
SPONGE GAUZE 2X2 STER 10/PKG (GAUZE/BANDAGES/DRESSINGS) ×2
SUT MNCRL AB 4-0 PS2 18 (SUTURE) ×3 IMPLANT
TOWEL OR 17X24 6PK STRL BLUE (TOWEL DISPOSABLE) ×3 IMPLANT
TOWEL OR 17X26 10 PK STRL BLUE (TOWEL DISPOSABLE) ×3 IMPLANT
TRAY LAPAROSCOPIC (CUSTOM PROCEDURE TRAY) ×3 IMPLANT
TROCAR XCEL BLUNT TIP 100MML (ENDOMECHANICALS) ×3 IMPLANT
TROCAR XCEL NON-BLD 11X100MML (ENDOMECHANICALS) ×3 IMPLANT
TROCAR XCEL NON-BLD 5MMX100MML (ENDOMECHANICALS) ×3 IMPLANT

## 2014-02-05 NOTE — Discharge Instructions (Signed)
[facility] °LAPAROSCOPIC SURGERY: POST OP INSTRUCTIONS °Always review your discharge instruction sheet given to you by the facility where your surgery was performed. °IF YOU HAVE DISABILITY OR FAMILY LEAVE FORMS, YOU MUST BRING THEM TO THE OFFICE FOR PROCESSING.   °DO NOT GIVE THEM TO YOUR DOCTOR. ° °1. A prescription for pain medication will be given to you upon discharge.  Take your pain medication as prescribed, if needed.  If narcotic pain medicine is not needed, then you may take acetaminophen (Tylenol) or ibuprofen (Advil) as needed. °2. Take your usually prescribed medications unless otherwise directed. °3. If you need a refill on your pain medication, please contact your pharmacy.  They will contact our office to request authorization. Prescriptions will not be filled after 5pm or on week-ends. °4. You should follow a light diet the first few days after arrival home, such as soup and crackers, etc.  Be sure to include lots of fluids daily. °5. Most patients will experience some swelling and bruising in the area of the incisions.  Ice packs will help.  Swelling and bruising can take several days to resolve.  °6. It is common to experience some constipation if taking pain medication after surgery.  Increasing fluid intake and taking a stool softener (such as Colace) will usually help or prevent this problem from occurring.  A mild laxative (Milk of Magnesia or Miralax) should be taken according to package instructions if there are no bowel movements after 48 hours. °7. Unless discharge instructions indicate otherwise, you may remove your bandages 48 hours after surgery, and you may shower at that time.  You will have steri-strips (small skin tapes) in place directly over the incision.  These strips should be left on the skin for 7-10 days.  If your surgeon used skin glue on the incision, you may shower in 24 hours.  The glue will flake off over the next 2-3 weeks.  Any sutures or staples  will be removed at the office during your follow-up visit. °8. ACTIVITIES:  You may resume regular (light) daily activities beginning the next day--such as daily self-care, walking, climbing stairs--gradually increasing activities as tolerated.  You may have sexual intercourse when it is comfortable.  Refrain from any heavy lifting or straining until approved by your doctor. °a. You may drive when you are no longer taking prescription pain medication, you can comfortably wear a seatbelt, and you can safely maneuver your car and apply brakes. °b. RETURN TO WORK:   2-3 weeks °9. You should see your doctor in the office for a follow-up appointment approximately 2-3 weeks after your surgery.  Make sure that you call for this appointment within a day or two after you arrive home to insure a convenient appointment time. °10. OTHER INSTRUCTIONS: ________________________________________________________________________ °WHEN TO CALL YOUR DOCTOR: °1. Fever over 101.0 °2. Inability to urinate °3. Continued bleeding from incision. °4. Increased pain, redness, or drainage from the incision. °5. Increasing abdominal pain ° °The clinic staff is available to answer your questions during regular business hours.  Please don’t hesitate to call and ask to speak to one of the nurses for clinical concerns.  If you have a medical emergency, go to the nearest emergency room or call 911.  A surgeon from [facility] is always on call at the hospital. °[address], [address], [place], [place]  [address] ? [address] [address], [place], [place]   [address] °[phone] ? [phone] ? FAX [phone] °Web site:   [email or web address] ° ° °What to eat: ° °For your first meals, you should eat lightly; only small meals initially.  If you do not have nausea, you may eat larger meals.  Avoid spicy, greasy and heavy food.   ° °General Anesthesia, Adult, Care After  °Refer to this sheet in the next few weeks. These  instructions provide you with information on caring for yourself after your procedure. Your health care provider may also give you more specific instructions. Your treatment has been planned according to current medical practices, but problems sometimes occur. Call your health care provider if you have any problems or questions after your procedure.  °WHAT TO EXPECT AFTER THE PROCEDURE  °After the procedure, it is typical to experience:  °Sleepiness.  °Nausea and vomiting. °HOME CARE INSTRUCTIONS  °For the first 24 hours after general anesthesia:  °Have a responsible person with you.  °Do not drive a car. If you are alone, do not take public transportation.  °Do not drink alcohol.  °Do not take medicine that has not been prescribed by your health care provider.  °Do not sign important papers or make important decisions.  °You may resume a normal diet and activities as directed by your health care provider.  °Change bandages (dressings) as directed.  °If you have questions or problems that seem related to general anesthesia, call the hospital and ask for the anesthetist or anesthesiologist on call. °SEEK MEDICAL CARE IF:  °You have nausea and vomiting that continue the day after anesthesia.  °You develop a rash. °SEEK IMMEDIATE MEDICAL CARE IF:  °You have difficulty breathing.  °You have chest pain.  °You have any allergic problems. °Document Released: 01/10/2001 Document Revised: 06/06/2013 Document Reviewed: 04/19/2013  °ExitCare® Patient Information ©2014 [employer].  ° ° °

## 2014-02-05 NOTE — Anesthesia Procedure Notes (Signed)
Procedure Name: Intubation Date/Time: 02/05/2014 7:30 AM Performed by: Maeola Harman Pre-anesthesia Checklist: Patient identified, Emergency Drugs available, Suction available, Patient being monitored and Timeout performed Patient Re-evaluated:Patient Re-evaluated prior to inductionOxygen Delivery Method: Circle system utilized Preoxygenation: Pre-oxygenation with 100% oxygen Intubation Type: IV induction Ventilation: Mask ventilation without difficulty Laryngoscope Size: Mac and 3 Grade View: Grade I Tube type: Oral Tube size: 7.5 mm Number of attempts: 1 Airway Equipment and Method: Stylet Placement Confirmation: ETT inserted through vocal cords under direct vision,  positive ETCO2 and breath sounds checked- equal and bilateral Secured at: 21 cm Tube secured with: Tape Dental Injury: Teeth and Oropharynx as per pre-operative assessment  Comments: Easy atraumatic induction and intubation with MAC 3 blade.  Dr. Tamala Julian verified placement of ETT.

## 2014-02-05 NOTE — Anesthesia Postprocedure Evaluation (Signed)
  Anesthesia Post-op Note  Patient: Karen Berg  Procedure(s) Performed: Procedure(s): LAPAROSCOPIC CHOLECYSTECTOMY WITH INTRAOPERATIVE CHOLANGIOGRAM (N/A)  Patient Location: PACU  Anesthesia Type:General  Level of Consciousness: awake, alert , oriented and patient cooperative  Airway and Oxygen Therapy: Patient Spontanous Breathing  Post-op Pain: mild  Post-op Assessment: Post-op Vital signs reviewed, Patient's Cardiovascular Status Stable, Respiratory Function Stable, Patent Airway, No signs of Nausea or vomiting and Pain level controlled  Post-op Vital Signs: stable  Last Vitals:  Filed Vitals:   02/05/14 0915  BP: 105/60  Pulse: 76  Temp:   Resp: 21    Complications: No apparent anesthesia complications

## 2014-02-05 NOTE — Transfer of Care (Signed)
Immediate Anesthesia Transfer of Care Note  Patient: Karen Berg  Procedure(s) Performed: Procedure(s): LAPAROSCOPIC CHOLECYSTECTOMY WITH INTRAOPERATIVE CHOLANGIOGRAM (N/A)  Patient Location: PACU  Anesthesia Type:General  Level of Consciousness: awake, alert  and sedated  Airway & Oxygen Therapy: Patient connected to face mask oxygen  Post-op Assessment: Report given to PACU RN  Post vital signs: stable  Complications: No apparent anesthesia complications

## 2014-02-05 NOTE — Op Note (Signed)
Laparoscopic Cholecystectomy with IOC Procedure Note  Indications: This patient presents with symptomatic gallbladder disease and will undergo laparoscopic cholecystectomy.  Pre-operative Diagnosis: Calculus of gallbladder with other cholecystitis, without mention of obstruction  Post-operative Diagnosis: Same  Surgeon: [name]. Vinson Tietze   Assistants: none  Anesthesia: General endotracheal anesthesia  ASA Class: 1  Procedure Details  The patient was seen again in the Holding Room. The risks, benefits, complications, treatment options, and expected outcomes were discussed with the patient. The possibilities of reaction to medication, pulmonary aspiration, perforation of viscus, bleeding, recurrent infection, finding a normal gallbladder, the need for additional procedures, failure to diagnose a condition, the possible need to convert to an open procedure, and creating a complication requiring transfusion or operation were discussed with the patient. The likelihood of improving the patient's symptoms with return to their baseline status is good.  The patient and/or family concurred with the proposed plan, giving informed consent. The site of surgery properly noted. The patient was taken to Operating Room, identified as Karen Berg and the procedure verified as Laparoscopic Cholecystectomy with Intraoperative Cholangiogram. A Time Out was held and the above information confirmed.  Prior to the induction of general anesthesia, antibiotic prophylaxis was administered. General endotracheal anesthesia was then administered and tolerated well. After the induction, the abdomen was prepped with Chloraprep and draped in the sterile fashion. The patient was positioned in the supine position.  Local anesthetic agent was injected into the skin near the umbilicus and an incision made. We dissected down to the abdominal fascia with blunt dissection.  The fascia was incised vertically and we entered the  peritoneal cavity bluntly.  A pursestring suture of 0-Vicryl was placed around the fascial opening.  The Hasson cannula was inserted and secured with the stay suture.  Pneumoperitoneum was then created with CO2 and tolerated well without any adverse changes in the patient's vital signs. An 11-mm port was placed in the subxiphoid position.  Two 5-mm ports were placed in the right upper quadrant. All skin incisions were infiltrated with a local anesthetic agent before making the incision and placing the trocars.   We positioned the patient in reverse Trendelenburg, tilted slightly to the patient's left.  The gallbladder was identified, the fundus grasped and retracted cephalad. Adhesions were lysed bluntly and with the electrocautery where indicated, taking care not to injure any adjacent organs or viscus. The infundibulum was grasped and retracted laterally, exposing the peritoneum overlying the triangle of Calot. This was then divided and exposed in a blunt fashion. A critical view of the cystic duct and cystic artery was obtained.  The cystic duct was clearly identified and bluntly dissected circumferentially. The cystic duct was ligated with a clip distally.   An incision was made in the cystic duct and the [facility] cholangiogram catheter introduced. The catheter was secured using a clip. A cholangiogram was then obtained which showed good visualization of the distal and proximal biliary tree with no sign of filling defects or obstruction.  Contrast flowed easily into the duodenum. The catheter was then removed.   The cystic duct was then ligated with clips and divided. The cystic artery was identified, dissected free, ligated with clips and divided as well.   The gallbladder was dissected from the liver bed in retrograde fashion with the electrocautery. A small amount of bile was spilled, but no stones were noted.  The gallbladder was removed and placed in an Endocatch sac. The liver bed was irrigated until  clear.  Hemostasis was  achieved with the electrocautery and SNOW. Copious irrigation was utilized and was repeatedly aspirated until clear.  The gallbladder and Endocatch sac were then removed through the umbilical port site.  The pursestring suture was used to close the umbilical fascia.    We again inspected the right upper quadrant for hemostasis.  Pneumoperitoneum was released as we removed the trocars.  4-0 Monocryl was used to close the skin.   Benzoin, steri-strips, and clean dressings were applied. The patient was then extubated and brought to the recovery room in stable condition. Instrument, sponge, and needle counts were correct at closure and at the conclusion of the case.   Findings: Cholecystitis with Cholelithiasis  Estimated Blood Loss: Minimal         Drains: none         Specimens: Gallbladder           Complications: None; patient tolerated the procedure well.         Disposition: PACU - hemodynamically stable.         Condition: stable  [name]. Georgette Dover, MD, [facility]  General/ Trauma Surgery  02/05/2014 8:38 AM

## 2014-02-05 NOTE — H&P (Signed)
HPI  Karen Berg is a 28 y.o. female. Referred by Dr. Maurice Small for symptomatic gallbladder disease  Abdominal Pain Associated symptoms: no chest pain, no chills, no constipation, no cough, no diarrhea, no fever, no hematuria, no nausea, no sore throat, no vaginal bleeding and no vomiting  This is a healthy 28 year old female who presents with 3 recent episodes of severe epigastric abdominal pain that radiated through to her back. These episodes are associated with nausea but no vomiting. She denies any abdominal bloating or diarrhea. Her symptoms seem to be related to eating. She underwent a workup including blood work and an ultrasound. Ultrasound showed gallstones. Liver function test are normal.  Past Medical History   Diagnosis  Date   .  Migraine    .  Eczema    History reviewed. No pertinent past surgical history.  History reviewed. No pertinent family history.  Social History  History   Substance Use Topics   .  Smoking status:  Never Smoker   .  Smokeless tobacco:  Never Used   .  Alcohol Use:  Yes      Comment: rarely   No Known Allergies  Current Outpatient Prescriptions   Medication  Sig  Dispense  Refill   .  cyclobenzaprine (FLEXERIL) 10 MG tablet  Take 10 mg by mouth 3 (three) times daily as needed (migraines).     .  eletriptan (RELPAX) 40 MG tablet  Take 40 mg by mouth as needed for migraine. One tablet by mouth at onset of headache. May repeat in 2 hours if headache persists or recurs.     Marland Kitchen  ibuprofen (ADVIL,MOTRIN) 800 MG tablet  Take 800 mg by mouth every 8 (eight) hours as needed.     .  norgestimate-ethinyl estradiol (ORTHO-CYCLEN,SPRINTEC,PREVIFEM) 0.25-35 MG-MCG tablet  Take 1 tablet by mouth daily.     Marland Kitchen  topiramate (TOPAMAX) 50 MG tablet  Take 100 mg by mouth at bedtime.     .  triamcinolone ointment (KENALOG) 0.1 %  Apply 1 application topically 2 (two) times daily as needed (eczema).      No current facility-administered medications for this visit.    Review of Systems  Review of Systems  Constitutional: Negative for fever, chills and unexpected weight change.  HENT: Negative for congestion, hearing loss, sore throat, trouble swallowing and voice change.  Eyes: Negative for visual disturbance.  Respiratory: Negative for cough and wheezing.  Cardiovascular: Negative for chest pain, palpitations and leg swelling.  Gastrointestinal: Positive for abdominal pain. Negative for nausea, vomiting, diarrhea, constipation, blood in stool, abdominal distention and anal bleeding.  Genitourinary: Negative for hematuria, vaginal bleeding and difficulty urinating.  Musculoskeletal: Negative for arthralgias.  Skin: Negative for rash and wound.  Neurological: Negative for seizures, syncope and headaches.  Hematological: Negative for adenopathy. Does not bruise/bleed easily.  Psychiatric/Behavioral: Negative for confusion.  Blood pressure 122/79, pulse 82, temperature 98.2 F (36.8 C), temperature source Temporal, resp. rate 12, height 5\' 1"  (1.549 m), weight 138 lb 3.2 oz (62.687 kg).  Physical Exam  Physical Exam  WDWN in NAD  HEENT: EOMI, sclera anicteric  Neck: No masses, no thyromegaly  Lungs: CTA bilaterally; normal respiratory effort  CV: Regular rate and rhythm; no murmurs  Abd: +bowel sounds, soft, mild epigastric/ RUQ tenderness, no masses  Ext: Well-perfused; no edema  Skin: Warm, dry; no sign of jaundice  Data Reviewed  US Abdomen Complete  11/16/2013 CLINICAL DATA: Abdominal pain EXAM: ULTRASOUND ABDOMEN COMPLETE COMPARISON: None. FINDINGS:  Gallbladder: Small echogenic stones, mobile. No wall thickening. No pericholecystic fluid. Common bile duct: Diameter: 4.1 mm. No duct stone is seen. Liver: No focal lesion identified. Within normal limits in parenchymal echogenicity. IVC: No abnormality visualized. Pancreas: Visualized portion unremarkable. Spleen: Size and appearance within normal limits. Right Kidney: Length: 10.7 cm. Echogenicity  within normal limits. No mass or hydronephrosis visualized. Left Kidney: Length: 10.2 cm. Echogenicity within normal limits. No mass or hydronephrosis visualized. Abdominal aorta: No aneurysm visualized. Other findings: None. IMPRESSION: Cholelithiasis. No evidence acute cholecystitis. No other abnormalities. Electronically Signed By: Lajean Manes M.D. On: 11/16/2013 13:28  Assessment  Chronic calculus cholecystitis.  Plan  Laparoscopic cholecystectomy with intraoperative cholangiogram. The surgical procedure has been discussed with the patient. Potential risks, benefits, alternative treatments, and expected outcomes have been explained. All of the patient's questions at this time have been answered. The likelihood of reaching the patient's treatment goal is good. The patient understand the proposed surgical procedure and wishes to proceed.  Karen Berg. Karen Dover, MD, [facility]  General/ Trauma Surgery  02/05/2014 7:16 AM

## 2014-02-05 NOTE — Anesthesia Preprocedure Evaluation (Addendum)
Anesthesia Evaluation  Patient identified by MRN, date of birth, ID band Patient awake    Reviewed: Allergy & Precautions, H&P , NPO status , Patient's Chart, lab work & pertinent test results, reviewed documented beta blocker date and time   History of Anesthesia Complications (+) Family history of anesthesia reaction  Airway       Dental   Pulmonary neg pulmonary ROS,          Cardiovascular negative cardio ROS      Neuro/Psych  Headaches, negative psych ROS   GI/Hepatic Neg liver ROS, GERD-  Medicated and Controlled,  Endo/Other  negative endocrine ROS  Renal/GU negative Renal ROS     Musculoskeletal negative musculoskeletal ROS (+)   Abdominal   Peds  Hematology negative hematology ROS (+)   Anesthesia Other Findings   Reproductive/Obstetrics negative OB ROS                          Anesthesia Physical Anesthesia Plan  ASA: I  Anesthesia Plan: General   Post-op Pain Management:    Induction: Intravenous  Airway Management Planned: Oral ETT  Additional Equipment:   Intra-op Plan:   Post-operative Plan: Extubation in OR  Informed Consent: I have reviewed the patients History and Physical, chart, labs and discussed the procedure including the risks, benefits and alternatives for the proposed anesthesia with the patient or authorized representative who has indicated his/her understanding and acceptance.     Plan Discussed with:   Anesthesia Plan Comments:         Anesthesia Quick Evaluation

## 2014-02-07 ENCOUNTER — Encounter (HOSPITAL_COMMUNITY): Payer: Self-pay | Admitting: Surgery

## 2014-02-11 ENCOUNTER — Telehealth (INDEPENDENT_AMBULATORY_CARE_PROVIDER_SITE_OTHER): Payer: Self-pay

## 2014-02-11 NOTE — Telephone Encounter (Signed)
Patient called to ask if tylenol would be ok to take instead of pain medication for rt shoulder pain, advised it would be ok tylenol take 2 QiD if needed no more than 4000mg 

## 2014-02-25 ENCOUNTER — Encounter (INDEPENDENT_AMBULATORY_CARE_PROVIDER_SITE_OTHER): Payer: 59 | Admitting: Surgery

## 2014-02-25 ENCOUNTER — Encounter (INDEPENDENT_AMBULATORY_CARE_PROVIDER_SITE_OTHER): Payer: Self-pay | Admitting: Surgery

## 2014-02-25 ENCOUNTER — Ambulatory Visit (INDEPENDENT_AMBULATORY_CARE_PROVIDER_SITE_OTHER): Payer: 59 | Admitting: Surgery

## 2014-02-25 VITALS — BP 118/78 | HR 64 | Temp 98.0°F | Resp 14 | Ht 61.0 in | Wt 129.0 lb

## 2014-02-25 DIAGNOSIS — K8010 Calculus of gallbladder with chronic cholecystitis without obstruction: Secondary | ICD-10-CM

## 2014-02-25 NOTE — Progress Notes (Signed)
S/p laparoscopic cholecystectomy with IOC on 4/21 for chronic calculus cholecystitis.  She is feeling well.  She had one episode when she ate some broiled fish and had some crampy diarrhea.  Otherwise, she is tolerating a regular diet.  No nausea.  Filed Vitals:   02/25/14 0947  BP: 118/78  Pulse: 64  Temp: 98 F (36.7 C)  Resp: 14   Incisions are well-healed with no sign of infection Abd - soft, non-tender.  She may resume full activity.  Follow-up PRN.  [name]. Georgette Dover, MD, [facility]  General/ Trauma Surgery  02/25/2014 10:00 AM

## 2014-02-26 ENCOUNTER — Telehealth (INDEPENDENT_AMBULATORY_CARE_PROVIDER_SITE_OTHER): Payer: Self-pay | Admitting: General Surgery

## 2014-02-26 NOTE — Telephone Encounter (Signed)
LMOM to let her know that she can go back to exercising

## 2014-02-26 NOTE — Telephone Encounter (Signed)
Message copied by Maryclare Bean on Tue Feb 26, 2014  4:49 PM ------      Message from: Hunt Oris      Created: Tue Feb 26, 2014  1:25 PM      Regarding: Exercise Regimen      Contact: [phone]       Pt is requesting when to return back to exercising?   ------

## 2014-03-18 ENCOUNTER — Other Ambulatory Visit: Payer: Self-pay | Admitting: Obstetrics and Gynecology

## 2014-03-18 ENCOUNTER — Other Ambulatory Visit (HOSPITAL_COMMUNITY)
Admission: RE | Admit: 2014-03-18 | Discharge: 2014-03-18 | Disposition: A | Discharge status: Home / Self Care | Payer: 59 | Source: Ambulatory Visit | Attending: Obstetrics and Gynecology | Admitting: Obstetrics and Gynecology

## 2014-03-18 DIAGNOSIS — Z01419 Encounter for gynecological examination (general) (routine) without abnormal findings: Secondary | ICD-10-CM | POA: Insufficient documentation

## 2015-01-20 DIAGNOSIS — G43119 Migraine with aura, intractable, without status migrainosus: Secondary | ICD-10-CM | POA: Insufficient documentation

## 2015-01-20 DIAGNOSIS — L309 Dermatitis, unspecified: Secondary | ICD-10-CM | POA: Insufficient documentation

## 2015-01-20 HISTORY — DX: Dermatitis, unspecified: L30.9

## 2016-02-19 IMAGING — RF DG CHOLANGIOGRAM OPERATIVE
1 series · 5 of 5 positions shown · non-contrast
Comparison: None.

CLINICAL DATA: Cholecystectomy.

EXAM:
INTRAOPERATIVE CHOLANGIOGRAM
TECHNIQUE: Cholangiographic images from the C-arm fluoroscopic device were
submitted for interpretation post-operatively. Please see the
procedural report for the amount of contrast and the fluoroscopy
time utilized.

[Series 1: run · 2 acquisitions, 5 frames shown]
[im 1/2]
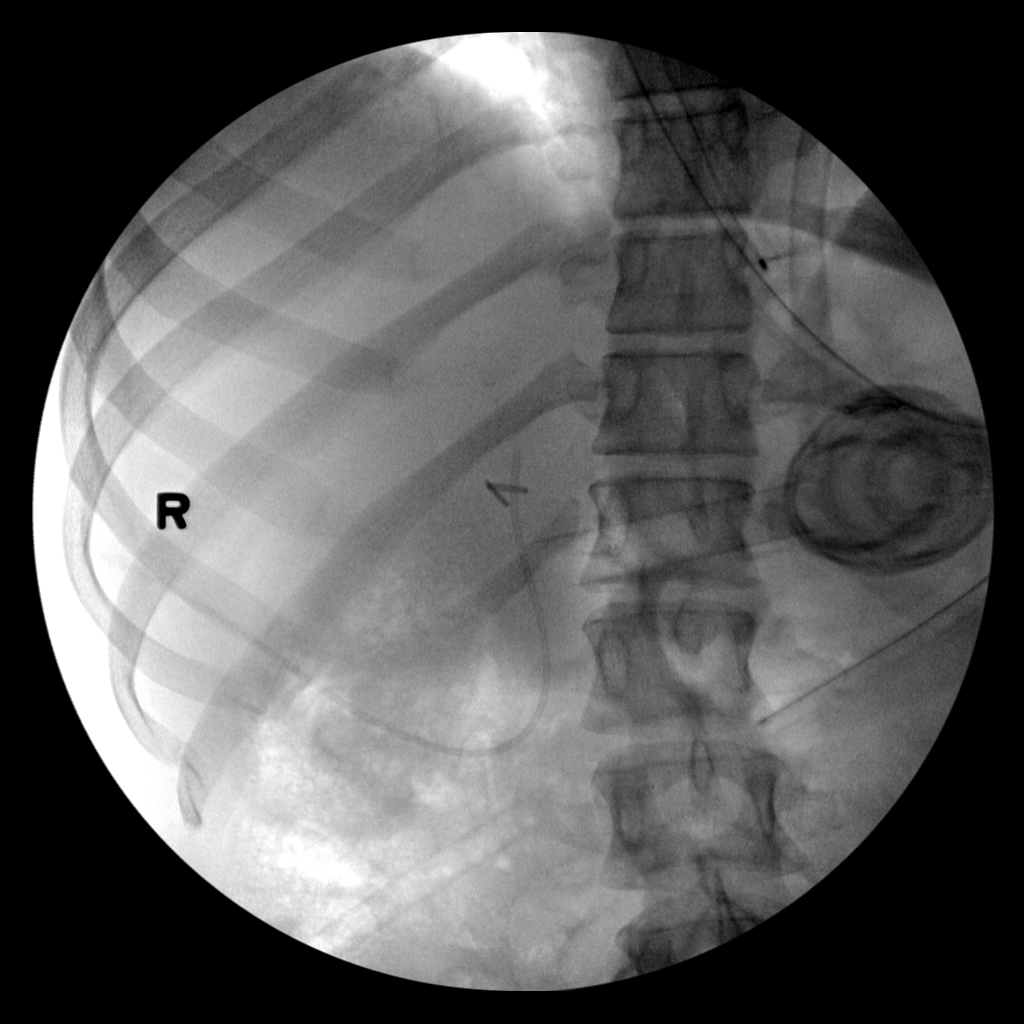
[im 1/2]
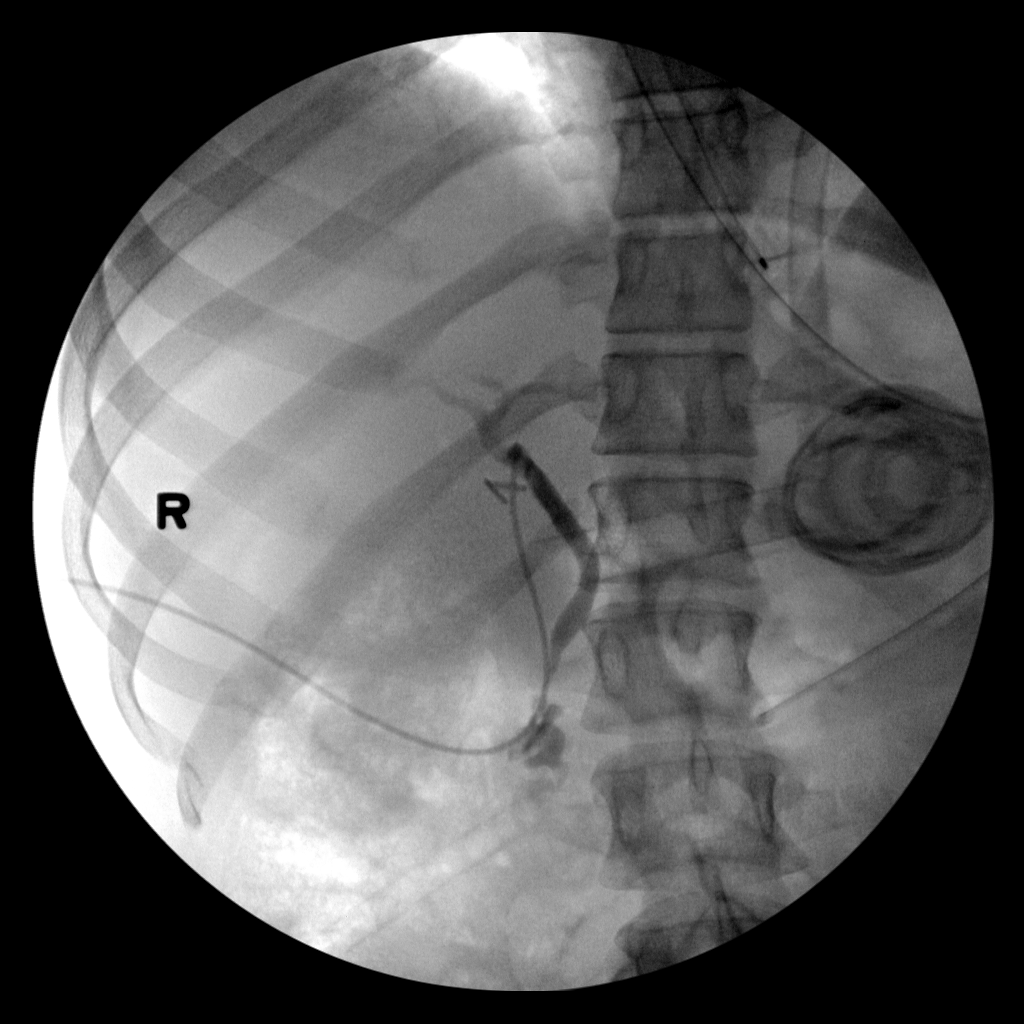
[im 1/2]
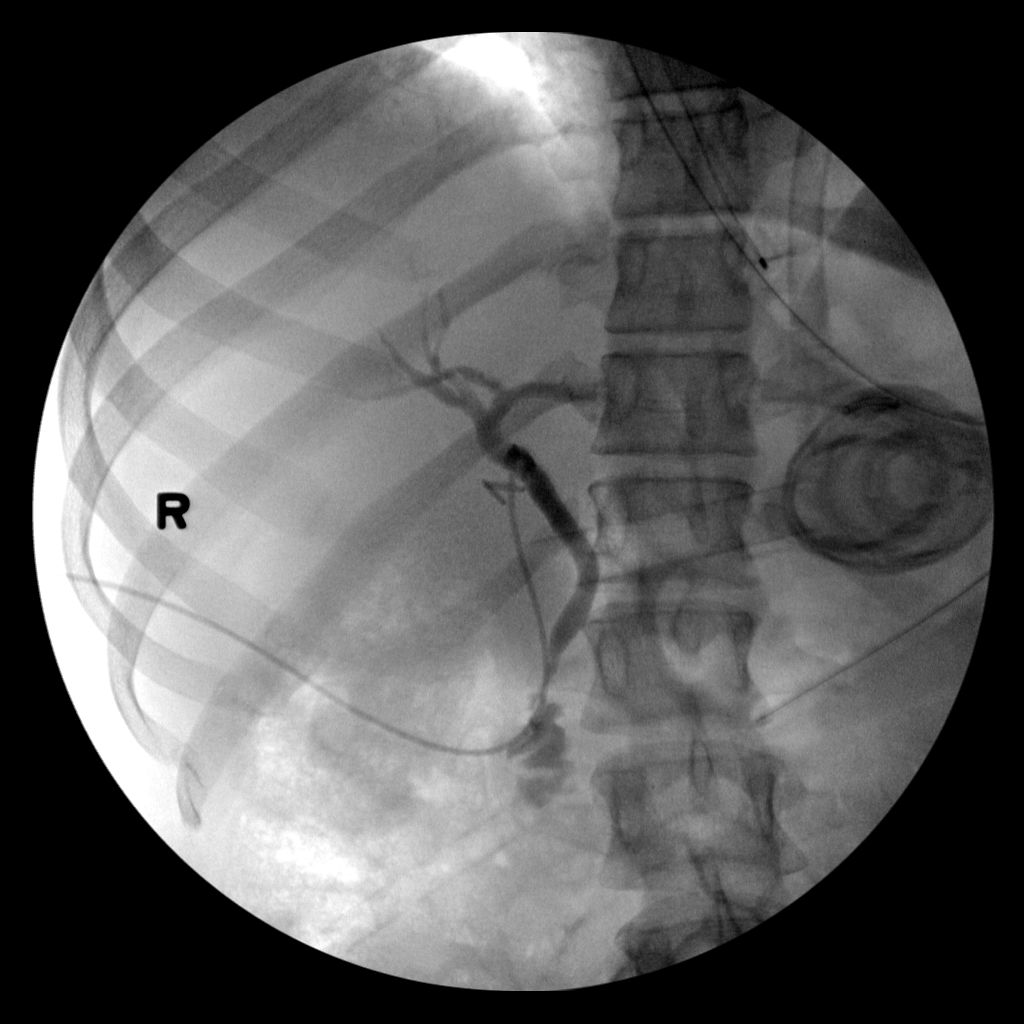
[im 1/2]
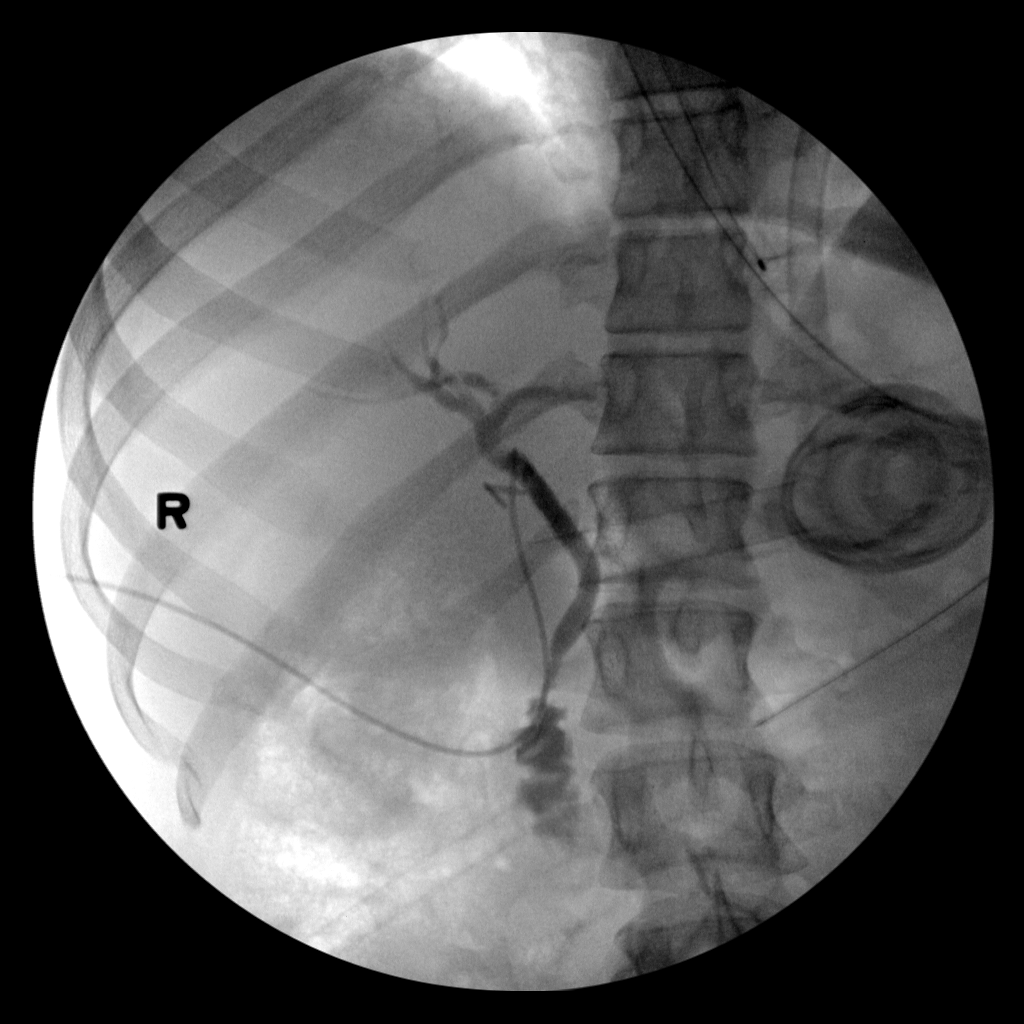
[im 2/2]
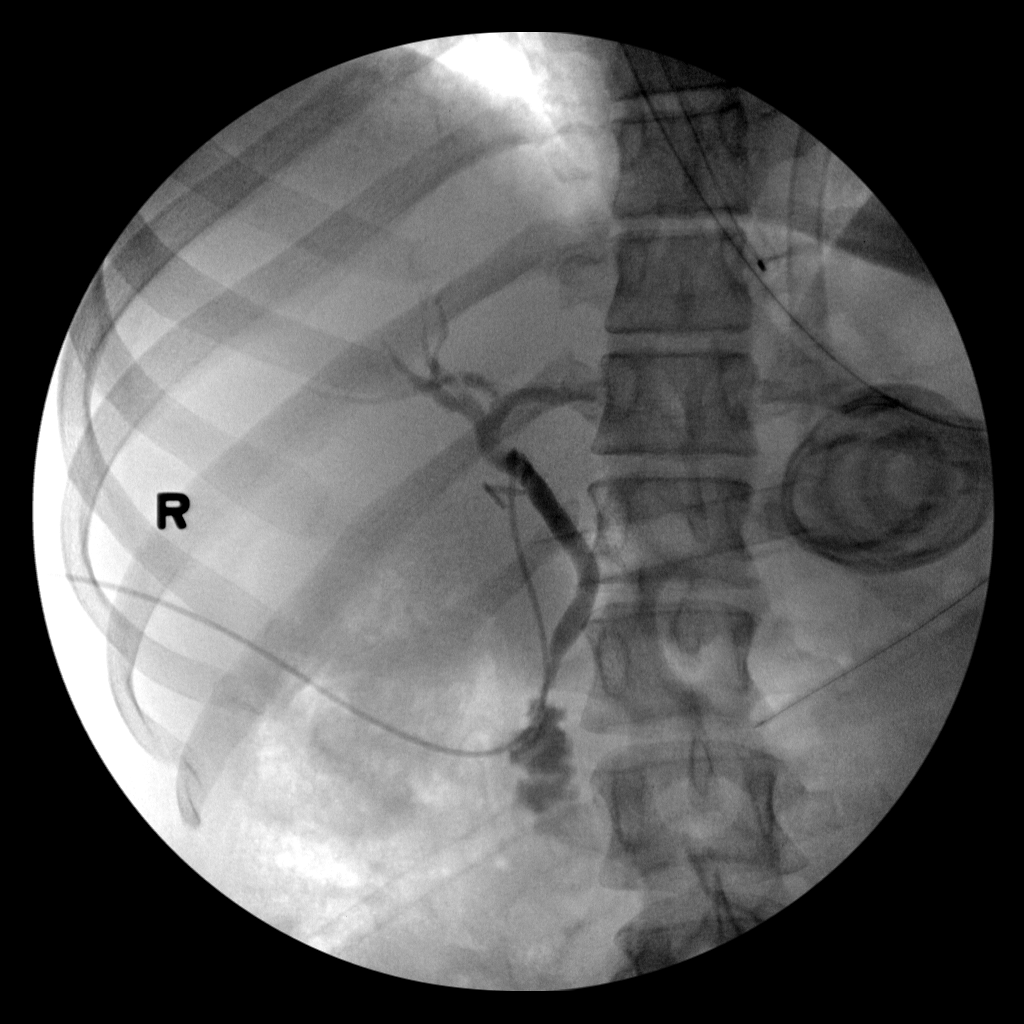

[5 of 5 positions shown; findings below may reference images not displayed]

FINDINGS: Bile ducts are normal in caliber. No biliary obstruction. There is
contrast in the duodenum. No common duct stone identified. Negative
for biliary leak.
IMPRESSION: Negative intraoperative cholangiogram.

## 2016-10-13 LAB — OB RESULTS CONSOLE ANTIBODY SCREEN: ANTIBODY SCREEN: NEGATIVE

## 2016-10-13 LAB — OB RESULTS CONSOLE GC/CHLAMYDIA
CHLAMYDIA, DNA PROBE: NEGATIVE
Gonorrhea: NEGATIVE

## 2016-10-13 LAB — OB RESULTS CONSOLE HEPATITIS B SURFACE ANTIGEN: HEP B S AG: NEGATIVE

## 2016-10-13 LAB — OB RESULTS CONSOLE RPR: RPR: NONREACTIVE

## 2016-10-13 LAB — OB RESULTS CONSOLE GBS: GBS: NEGATIVE

## 2016-10-13 LAB — OB RESULTS CONSOLE ABO/RH: RH TYPE: POSITIVE

## 2016-10-13 LAB — OB RESULTS CONSOLE RUBELLA ANTIBODY, IGM: RUBELLA: IMMUNE

## 2016-10-13 LAB — OB RESULTS CONSOLE HIV ANTIBODY (ROUTINE TESTING): HIV: NONREACTIVE

## 2016-10-28 ENCOUNTER — Inpatient Hospital Stay (HOSPITAL_COMMUNITY): Payer: 59

## 2016-10-28 ENCOUNTER — Inpatient Hospital Stay (HOSPITAL_COMMUNITY)
Admission: AD | Admit: 2016-10-28 | Discharge: 2016-10-28 | Disposition: A | Discharge status: Home / Self Care | Payer: 59 | Source: Ambulatory Visit | Attending: Obstetrics & Gynecology | Admitting: Obstetrics & Gynecology

## 2016-10-28 ENCOUNTER — Encounter (HOSPITAL_COMMUNITY): Payer: Self-pay | Admitting: *Deleted

## 2016-10-28 DIAGNOSIS — Z3A08 8 weeks gestation of pregnancy: Secondary | ICD-10-CM | POA: Diagnosis not present

## 2016-10-28 DIAGNOSIS — O209 Hemorrhage in early pregnancy, unspecified: Secondary | ICD-10-CM | POA: Diagnosis not present

## 2016-10-28 DIAGNOSIS — Z3A01 Less than 8 weeks gestation of pregnancy: Secondary | ICD-10-CM | POA: Insufficient documentation

## 2016-10-28 LAB — CBC WITH DIFFERENTIAL/PLATELET
Basophils Absolute: 0 10*3/uL (ref 0.0–0.1)
Basophils Relative: 0 %
Eosinophils Absolute: 0 10*3/uL (ref 0.0–0.7)
Eosinophils Relative: 1 %
HEMATOCRIT: 29.8 % — AB (ref 36.0–46.0)
Hemoglobin: 10.4 g/dL — ABNORMAL LOW (ref 12.0–15.0)
LYMPHS ABS: 2 10*3/uL (ref 0.7–4.0)
LYMPHS PCT: 30 %
MCH: 30.1 pg (ref 26.0–34.0)
MCHC: 34.9 g/dL (ref 30.0–36.0)
MCV: 86.1 fL (ref 78.0–100.0)
Monocytes Absolute: 0.3 10*3/uL (ref 0.1–1.0)
Monocytes Relative: 4 %
NEUTROS ABS: 4.2 10*3/uL (ref 1.7–7.7)
Neutrophils Relative %: 65 %
Platelets: 319 10*3/uL (ref 150–400)
RBC: 3.46 MIL/uL — AB (ref 3.87–5.11)
RDW: 14.3 % (ref 11.5–15.5)
WBC: 6.5 10*3/uL (ref 4.0–10.5)

## 2016-10-28 LAB — URINALYSIS, ROUTINE W REFLEX MICROSCOPIC
BILIRUBIN URINE: NEGATIVE
Glucose, UA: NEGATIVE mg/dL
Ketones, ur: NEGATIVE mg/dL
LEUKOCYTES UA: NEGATIVE
Nitrite: NEGATIVE
Protein, ur: 30 mg/dL — AB
SPECIFIC GRAVITY, URINE: 1.02 (ref 1.005–1.030)
pH: 6 (ref 5.0–8.0)

## 2016-10-28 LAB — HCG, QUANTITATIVE, PREGNANCY: hCG, Beta Chain, Quant, S: 225326 m[IU]/mL — ABNORMAL HIGH (ref <5)

## 2016-10-28 LAB — POCT PREGNANCY, URINE: PREG TEST UR: POSITIVE — AB

## 2016-10-28 NOTE — MAU Provider Note (Signed)
History     CSN: [id number]  Arrival date and time: 10/28/16 2059   First Provider Initiated Contact with Patient 10/28/16 2135      Chief Complaint  Patient presents with  . Vaginal Bleeding   Vaginal Bleeding  The patient's primary symptoms include pelvic pain and vaginal bleeding. The patient's pertinent negatives include no vaginal discharge. This is a new problem. The current episode started today. The problem occurs intermittently. The problem has been unchanged. Pain severity now: 2/10. The problem affects the right side. She is pregnant. Associated symptoms include abdominal pain and nausea. Pertinent negatives include no chills, fever or vomiting. The vaginal discharge was bloody. The vaginal bleeding is spotting. She has not been passing clots. She has not been passing tissue. Nothing aggravates the symptoms. She has tried nothing for the symptoms. She uses oral contraceptives (stopped in August ) for contraception. Her menstrual history has been regular (LMP 09/04/16 ).     Past Medical History:  Diagnosis Date  . Eczema   . Family history of anesthesia complication    Mother had PONV  . GERD (gastroesophageal reflux disease)   . Migraine     Past Surgical History:  Procedure Laterality Date  . CHOLECYSTECTOMY N/A 02/05/2014   Procedure: LAPAROSCOPIC CHOLECYSTECTOMY WITH INTRAOPERATIVE CHOLANGIOGRAM;  Surgeon: [name]. Georgette Dover, MD;  Location: [place] [facility];  Service: General;  Laterality: N/A;  . WISDOM TOOTH EXTRACTION      Family History  Problem Relation Age of Onset  . Diabetes Other   . Cancer - Prostate Other   . Cancer - Lung Other     Social History  Substance Use Topics  . Smoking status: Never Smoker  . Smokeless tobacco: Never Used  . Alcohol use Yes     Comment: rarely    Allergies: No Known Allergies  Prescriptions Prior to Admission  Medication Sig Dispense Refill Last Dose  . flurbiprofen (ANSAID) 100 MG tablet Take 100 mg by mouth 2 (two) times  daily as needed (migraine).   Taking  . ibuprofen (ADVIL,MOTRIN) 800 MG tablet Take 800 mg by mouth every 8 (eight) hours as needed for moderate pain.    Taking  . norgestimate-ethinyl estradiol (ORTHO-CYCLEN,SPRINTEC,PREVIFEM) 0.25-35 MG-MCG tablet Take 1 tablet by mouth daily.   Taking  . omeprazole (PRILOSEC) 40 MG capsule Take 40 mg by mouth daily.   Taking  . SUMAtriptan (IMITREX) 20 MG/ACT nasal spray Place 20 mg into the nose every 2 (two) hours as needed for migraine or headache. May repeat in 2 hours if headache persists or recurs.   Taking  . topiramate (TOPAMAX) 50 MG tablet Take 175 mg by mouth at bedtime.    Taking  . triamcinolone ointment (KENALOG) 0.1 % Apply 1 application topically 2 (two) times daily as needed (eczema).   Taking    Review of Systems  Constitutional: Negative for chills and fever.  Gastrointestinal: Positive for abdominal pain and nausea. Negative for vomiting.  Genitourinary: Positive for pelvic pain and vaginal bleeding. Negative for vaginal discharge.   Physical Exam   Blood pressure 132/79, pulse 84, temperature 98.4 F (36.9 C), temperature source Oral, resp. rate 18, height 5\' 1"  (1.549 m), weight 170 lb 12 oz (77.5 kg), last menstrual period 09/04/2016, SpO2 100 %.  Physical Exam  Nursing note and vitals reviewed. Constitutional: She is oriented to person, place, and time. She appears well-developed and well-nourished. No distress.  HENT:  Head: Normocephalic.  Cardiovascular: Normal rate.   Respiratory: Effort normal.  GI: Soft. There is no tenderness. There is no rebound.  Musculoskeletal: Normal range of motion.  Neurological: She is alert and oriented to person, place, and time.  Skin: Skin is warm and dry.  Psychiatric: She has a normal mood and affect.   Results for orders placed or performed during the hospital encounter of 10/28/16 (from the past 24 hour(s))  Urinalysis, Routine w reflex microscopic     Status: Abnormal   Collection  Time: 10/28/16  9:15 PM  Result Value Ref Range   Color, Urine YELLOW YELLOW   APPearance HAZY (A) CLEAR   Specific Gravity, Urine 1.020 1.005 - 1.030   pH 6.0 5.0 - 8.0   Glucose, UA NEGATIVE NEGATIVE mg/dL   Hgb urine dipstick LARGE (A) NEGATIVE   Bilirubin Urine NEGATIVE NEGATIVE   Ketones, ur NEGATIVE NEGATIVE mg/dL   Protein, ur 30 (A) NEGATIVE mg/dL   Nitrite NEGATIVE NEGATIVE   Leukocytes, UA NEGATIVE NEGATIVE   RBC / HPF TOO NUMEROUS TO COUNT 0 - 5 RBC/hpf   WBC, UA 0-5 0 - 5 WBC/hpf   Bacteria, UA RARE (A) NONE SEEN   Squamous Epithelial / LPF 6-30 (A) NONE SEEN   Mucous PRESENT   Pregnancy, urine POC     Status: Abnormal   Collection Time: 10/28/16  9:24 PM  Result Value Ref Range   Preg Test, Ur POSITIVE (A) NEGATIVE  CBC with Differential/Platelet     Status: Abnormal   Collection Time: 10/28/16  9:45 PM  Result Value Ref Range   WBC 6.5 4.0 - 10.5 K/uL   RBC 3.46 (L) 3.87 - 5.11 MIL/uL   Hemoglobin 10.4 (L) 12.0 - 15.0 g/dL   HCT 29.8 (L) 36.0 - 46.0 %   MCV 86.1 78.0 - 100.0 fL   MCH 30.1 26.0 - 34.0 pg   MCHC 34.9 30.0 - 36.0 g/dL   RDW 14.3 11.5 - 15.5 %   Platelets 319 150 - 400 K/uL   Neutrophils Relative % 65 %   Neutro Abs 4.2 1.7 - 7.7 K/uL   Lymphocytes Relative 30 %   Lymphs Abs 2.0 0.7 - 4.0 K/uL   Monocytes Relative 4 %   Monocytes Absolute 0.3 0.1 - 1.0 K/uL   Eosinophils Relative 1 %   Eosinophils Absolute 0.0 0.0 - 0.7 K/uL   Basophils Relative 0 %   Basophils Absolute 0.0 0.0 - 0.1 K/uL  hCG, quantitative, pregnancy     Status: Abnormal   Collection Time: 10/28/16  9:45 PM  Result Value Ref Range   hCG, Beta Chain, Quant, S 225,326 (H) <5 mIU/mL  ABO/Rh     Status: None (Preliminary result)   Collection Time: 10/28/16  9:45 PM  Result Value Ref Range   ABO/RH(D) O POS    US Ob Comp Less 14 Wks  Result Date: 10/28/2016 CLINICAL DATA:  Lower abdominal pain with blood on tissue. Estimated gestational age by LMP is 7 weeks 5 days.  Quantitative beta HCG is pending. EXAM: OBSTETRIC <14 WK ULTRASOUND TECHNIQUE: Transabdominal ultrasound was performed for evaluation of the gestation as well as the maternal uterus and adnexal regions. COMPARISON:  None. FINDINGS: Intrauterine gestational sac: A single intrauterine gestational sac is visualized. Yolk sac:  Yolk sac is present. Embryo:  Fetal pole is present. Cardiac Activity: Fetal cardiac activity is observed. Heart Rate: 153 bpm CRL:   16.6  mm   8 w 0 d  [place] EDC: 06/09/2017 Subchorionic hemorrhage: A small subchorionic hemorrhage is demonstrated anteriorly. Maternal uterus/adnexae: Uterus appears somewhat retroverted. Diffuse heterogeneous nodular enlargement of the uterus consistent with multiple uterine fibroids throughout all segments. Largest individual fibroid is measured in the right fundal region at 9.8 cm maximal diameter. Both ovaries are visualized and appear normal. No free fluid is indicated. IMPRESSION: Single intrauterine pregnancy. Estimated gestational age by crown-rump length is 8 weeks 0 days. A small subchorionic hemorrhage is noted. The uterus is diffusely enlarged and filled with multiple fibroids, measuring up to 9.8 cm maximal diameter. Electronically Signed   By: Lucienne Capers M.D.   On: 10/28/2016 22:22   MAU Course  Procedures  MDM   Assessment and Plan   1. [redacted] weeks gestation of pregnancy   2. Vaginal bleeding in pregnancy, first trimester    DC home Comfort measures reviewed  1st Trimester precautions  RX: none  Return to MAU as needed FU with OB as planned  [facility]. Schedule an appointment as soon as possible for a visit.   Specialty:  Obstetrics and Gynecology Contact information: [address] [place] [phone]           Mathis Bud 10/28/2016, 9:37 PM

## 2016-10-28 NOTE — Discharge Instructions (Signed)
First Trimester of Pregnancy The first trimester of pregnancy is from week 1 until the end of week 12 (months 1 through 3). During this time, your baby will begin to develop inside you. At 6-8 weeks, the eyes and face are formed, and the heartbeat can be seen on ultrasound. At the end of 12 weeks, all the baby's organs are formed. Prenatal care is all the medical care you receive before the birth of your baby. Make sure you get good prenatal care and follow all of your doctor's instructions. Follow these instructions at home: Medicines  Take medicine only as told by your doctor. Some medicines are safe and some are not during pregnancy.  Take your prenatal vitamins as told by your doctor.  Take medicine that helps you poop (stool softener) as needed if your doctor says it is okay. Diet  Eat regular, healthy meals.  Your doctor will tell you the amount of weight gain that is right for you.  Avoid raw meat and uncooked cheese.  If you feel sick to your stomach (nauseous) or throw up (vomit):  Eat 4 or 5 small meals a day instead of 3 large meals.  Try eating a few soda crackers.  Drink liquids between meals instead of during meals.  If you have a hard time pooping (constipation):  Eat high-fiber foods like fresh vegetables, fruit, and whole grains.  Drink enough fluids to keep your pee (urine) clear or pale yellow. Activity and Exercise  Exercise only as told by your doctor. Stop exercising if you have cramps or pain in your lower belly (abdomen) or low back.  Try to avoid standing for long periods of time. Move your legs often if you must stand in one place for a long time.  Avoid heavy lifting.  Wear low-heeled shoes. Sit and stand up straight.  You can have sex unless your doctor tells you not to. Relief of Pain or Discomfort  Wear a good support bra if your breasts are sore.  Take warm water baths (sitz baths) to soothe pain or discomfort caused by hemorrhoids. Use  hemorrhoid cream if your doctor says it is okay.  Rest with your legs raised if you have leg cramps or low back pain.  Wear support hose if you have puffy, bulging veins (varicose veins) in your legs. Raise (elevate) your feet for 15 minutes, 3-4 times a day. Limit salt in your diet. Prenatal Care  Schedule your prenatal visits by the twelfth week of pregnancy.  Write down your questions. Take them to your prenatal visits.  Keep all your prenatal visits as told by your doctor. Safety  Wear your seat belt at all times when driving.  Make a list of emergency phone numbers. The list should include numbers for family, friends, the hospital, and police and fire departments. General Tips  Ask your doctor for a referral to a local prenatal class. Begin classes no later than at the start of month 6 of your pregnancy.  Ask for help if you need counseling or help with nutrition. Your doctor can give you advice or tell you where to go for help.  Do not use hot tubs, steam rooms, or saunas.  Do not douche or use tampons or scented sanitary pads.  Do not cross your legs for long periods of time.  Avoid litter boxes and soil used by cats.  Avoid all smoking, herbs, and alcohol. Avoid drugs not approved by your doctor.  Do not use any tobacco products,  including cigarettes, chewing tobacco, and electronic cigarettes. If you need help quitting, ask your doctor. You may get counseling or other support to help you quit.  Visit your dentist. At home, brush your teeth with a soft toothbrush. Be gentle when you floss. Get help if:  You are dizzy.  You have mild cramps or pressure in your lower belly.  You have a nagging pain in your belly area.  You continue to feel sick to your stomach, throw up, or have watery poop (diarrhea).  You have a bad smelling fluid coming from your vagina.  You have pain with peeing (urination).  You have increased puffiness (swelling) in your face, hands,  legs, or ankles. Get help right away if:  You have a fever.  You are leaking fluid from your vagina.  You have spotting or bleeding from your vagina.  You have very bad belly cramping or pain.  You gain or lose weight rapidly.  You throw up blood. It may look like coffee grounds.  You are around people who have [place] measles, fifth disease, or chickenpox.  You have a very bad headache.  You have shortness of breath.  You have any kind of trauma, such as from a fall or a car accident. This information is not intended to replace advice given to you by your health care provider. Make sure you discuss any questions you have with your health care provider.  Safe Medications in Pregnancy   Acne: Benzoyl Peroxide Salicylic Acid  Backache/Headache: Tylenol: 2 regular strength every 4 hours OR              2 Extra strength every 6 hours  Colds/Coughs/Allergies: Benadryl (alcohol free) 25 mg every 6 hours as needed Breath right strips Claritin Cepacol throat lozenges Chloraseptic throat spray Cold-Eeze- up to three times per day Cough drops, alcohol free Flonase (by prescription only) Guaifenesin Mucinex Robitussin DM (plain only, alcohol free) Saline nasal spray/drops Sudafed (pseudoephedrine) & Actifed ** use only after [redacted] weeks gestation and if you do not have high blood pressure Tylenol Vicks Vaporub Zinc lozenges Zyrtec   Constipation: Colace Ducolax suppositories Fleet enema Glycerin suppositories Metamucil Milk of magnesia Miralax Senokot Smooth move tea  Diarrhea: Kaopectate Imodium A-D  *NO pepto Bismol  Hemorrhoids: Anusol Anusol HC Preparation H Tucks  Indigestion: Tums Maalox Mylanta Zantac  Pepcid  Insomnia: Benadryl (alcohol free) 25mg  every 6 hours as needed Tylenol PM Unisom, no Gelcaps  Leg Cramps: Tums MagGel  Nausea/Vomiting:  Bonine Dramamine Emetrol Ginger extract Sea bands Meclizine  Nausea medication to  take during pregnancy:  Unisom (doxylamine succinate 25 mg tablets) Take one tablet daily at bedtime. If symptoms are not adequately controlled, the dose can be increased to a maximum recommended dose of two tablets daily (1/2 tablet in the morning, 1/2 tablet mid-afternoon and one at bedtime). Vitamin B6 100mg  tablets. Take one tablet twice a day (up to 200 mg per day).  Skin Rashes: Aveeno products Benadryl cream or 25mg  every 6 hours as needed Calamine Lotion 1% cortisone cream  Yeast infection: Gyne-lotrimin 7 Monistat 7   **If taking multiple medications, please check labels to avoid duplicating the same active ingredients **take medication as directed on the label ** Do not exceed 4000 mg of tylenol in 24 hours **Do not take medications that contain aspirin or ibuprofen

## 2016-10-28 NOTE — MAU Note (Signed)
Pt reports she noticed blood on the tissue when she wiped around 8 pm. No active bleeding. Some pain right lower abd/

## 2016-10-29 LAB — ABO/RH: ABO/RH(D): O POS

## 2016-10-29 LAB — RPR: RPR Ser Ql: NONREACTIVE

## 2016-10-29 LAB — HIV ANTIBODY (ROUTINE TESTING W REFLEX): HIV Screen 4th Generation wRfx: NONREACTIVE

## 2016-11-19 ENCOUNTER — Other Ambulatory Visit (HOSPITAL_COMMUNITY): Payer: Self-pay | Admitting: Obstetrics and Gynecology

## 2016-11-19 DIAGNOSIS — O3411 Maternal care for benign tumor of corpus uteri, first trimester: Secondary | ICD-10-CM

## 2016-11-19 DIAGNOSIS — D259 Leiomyoma of uterus, unspecified: Secondary | ICD-10-CM

## 2016-11-19 DIAGNOSIS — Z3A12 12 weeks gestation of pregnancy: Secondary | ICD-10-CM

## 2016-11-21 ENCOUNTER — Encounter (HOSPITAL_COMMUNITY): Payer: Self-pay

## 2016-11-30 ENCOUNTER — Ambulatory Visit (HOSPITAL_COMMUNITY)
Admission: RE | Admit: 2016-11-30 | Discharge: 2016-11-30 | Disposition: A | Discharge status: Home / Self Care | Payer: 59 | Source: Ambulatory Visit | Attending: Obstetrics and Gynecology | Admitting: Obstetrics and Gynecology

## 2016-11-30 ENCOUNTER — Other Ambulatory Visit (HOSPITAL_COMMUNITY): Payer: Self-pay | Admitting: *Deleted

## 2016-11-30 ENCOUNTER — Encounter (HOSPITAL_COMMUNITY): Payer: Self-pay

## 2016-11-30 DIAGNOSIS — D259 Leiomyoma of uterus, unspecified: Secondary | ICD-10-CM

## 2016-11-30 DIAGNOSIS — O3411 Maternal care for benign tumor of corpus uteri, first trimester: Secondary | ICD-10-CM | POA: Diagnosis not present

## 2016-11-30 DIAGNOSIS — O3410 Maternal care for benign tumor of corpus uteri, unspecified trimester: Principal | ICD-10-CM

## 2016-11-30 DIAGNOSIS — Z3A12 12 weeks gestation of pregnancy: Secondary | ICD-10-CM | POA: Diagnosis not present

## 2016-11-30 HISTORY — DX: Benign neoplasm of connective and other soft tissue, unspecified: D21.9

## 2016-11-30 NOTE — Progress Notes (Signed)
Maternal Fetal Medicine Consultation  Requesting Provider(s): Dillard   Primary Ob: Dillard Reason for consultation: Uterine fibroids  HPI: 31yo P0 at 12+3 weeks referred for uterine fibroids. She has known about her fibroids since her early 14s. She has never had any problem with them. She specifically denies sever menstrual cramping, abnormal menstrual or intermenstrual bleeding, dyspareunia, abnormal pelvic sensations, or infertility. She has had one episode of bleeding early in the pregnancy and nothing since that time. She has not yet felt fetal movement. She was previously on triptans and NSAIDs for migraines, but was taken off them by her neurologist when she became pregnant. She will be seeing her neurologist in early March  OB History: OB History    Gravida Para Term Preterm AB Living   1         0   SAB TAB Ectopic Multiple Live Births                  PMH:  Past Medical History:  Diagnosis Date  . Eczema   . Family history of anesthesia complication    Mother had PONV  . Fibroids   . GERD (gastroesophageal reflux disease)   . Migraine     PSH:  Past Surgical History:  Procedure Laterality Date  . CHOLECYSTECTOMY N/A 02/05/2014   Procedure: LAPAROSCOPIC CHOLECYSTECTOMY WITH INTRAOPERATIVE CHOLANGIOGRAM;  Surgeon: [name]. Georgette Dover, MD;  Location: [facility];  Service: General;  Laterality: N/A;  . WISDOM TOOTH EXTRACTION     Meds: Prenatal vitamins, triamcinalone cream Allergies: NKDA Fh: Mother and sister had fibroids that required hysterectomy Soc: denies tobacco, illicit drugs and alcohol  Review of Systems: no vaginal bleeding or cramping/contractions, no LOF, no nausea/vomiting. All other systems reviewed and are negative.  Pe: See EPIC section   Please see separate document for fetal ultrasound report.  A/P: large multiple uterine fibroids. Her fibroids place her in a higher risk category due to the size and number. They do not distort the uterine cavity. The  data on fibroids and pregnancy outcome are conflicting at best. She probably has a higher risk for PTD, postpartum hemorrhage,  Abruption, malpresentation (due to the anterior LUS fibroids) and IUGR. Of course she is also at risk for degeneration. I discussed all of these with the patient, tempering the discussion with the facts that these increased risk are not greatly increased. Excessive growth of the LUS fibroid my necessitate cesarean delivery, but otherwise there is no indication for C/S due to her fibroids. I have asked her to return for a repeat evaluation of growth and fibroid size in 4 weeks. I went over with her signs and symptoms of fibroid degeneration  Thank you for the opportunity to be a part of the care of [facility]. Please contact our office if we can be of further assistance.   I spent approximately 30 minutes with this patient with over 50% of time spent in face-to-face counseling.

## 2016-12-08 ENCOUNTER — Other Ambulatory Visit (HOSPITAL_COMMUNITY): Payer: Self-pay | Admitting: *Deleted

## 2016-12-08 DIAGNOSIS — D259 Leiomyoma of uterus, unspecified: Secondary | ICD-10-CM

## 2016-12-08 DIAGNOSIS — O3410 Maternal care for benign tumor of corpus uteri, unspecified trimester: Principal | ICD-10-CM

## 2016-12-17 ENCOUNTER — Inpatient Hospital Stay (HOSPITAL_COMMUNITY)
Admission: AD | Admit: 2016-12-17 | Discharge: 2016-12-17 | Disposition: A | Discharge status: Home / Self Care | Payer: 59 | Source: Ambulatory Visit | Attending: Obstetrics and Gynecology | Admitting: Obstetrics and Gynecology

## 2016-12-17 DIAGNOSIS — R1030 Lower abdominal pain, unspecified: Secondary | ICD-10-CM | POA: Diagnosis not present

## 2016-12-17 DIAGNOSIS — O3412 Maternal care for benign tumor of corpus uteri, second trimester: Secondary | ICD-10-CM | POA: Diagnosis not present

## 2016-12-17 DIAGNOSIS — O26892 Other specified pregnancy related conditions, second trimester: Secondary | ICD-10-CM

## 2016-12-17 DIAGNOSIS — D259 Leiomyoma of uterus, unspecified: Secondary | ICD-10-CM | POA: Diagnosis not present

## 2016-12-17 DIAGNOSIS — Z3A14 14 weeks gestation of pregnancy: Secondary | ICD-10-CM | POA: Insufficient documentation

## 2016-12-17 DIAGNOSIS — R102 Pelvic and perineal pain: Secondary | ICD-10-CM | POA: Diagnosis not present

## 2016-12-17 DIAGNOSIS — R109 Unspecified abdominal pain: Secondary | ICD-10-CM | POA: Diagnosis present

## 2016-12-17 LAB — URINALYSIS, ROUTINE W REFLEX MICROSCOPIC
BILIRUBIN URINE: NEGATIVE
Glucose, UA: NEGATIVE mg/dL
Hgb urine dipstick: NEGATIVE
Ketones, ur: NEGATIVE mg/dL
Leukocytes, UA: NEGATIVE
NITRITE: NEGATIVE
PH: 7 (ref 5.0–8.0)
Protein, ur: NEGATIVE mg/dL
SPECIFIC GRAVITY, URINE: 1.008 (ref 1.005–1.030)

## 2016-12-17 MED ORDER — IBUPROFEN 600 MG PO TABS
600.0000 mg | ORAL_TABLET | Freq: Once | ORAL | Status: AC
  Administered 2016-12-17: 600 mg via ORAL
  Filled 2016-12-17: qty 1

## 2016-12-17 MED ORDER — IBUPROFEN 600 MG PO TABS: 600.0000 mg | ORAL_TABLET | Freq: Four times a day (QID) | ORAL | 1 refills | Status: DC | PRN

## 2016-12-17 NOTE — Discharge Instructions (Signed)
Second Trimester of Pregnancy The second trimester is from week 13 through week 28, month 4 through 6. This is often the time in pregnancy that you feel your best. Often times, morning sickness has lessened or quit. You may have more energy, and you may get hungry more often. Your unborn baby (fetus) is growing rapidly. At the end of the sixth month, he or she is about 9 inches long and weighs about 1 pounds. You will likely feel the baby move (quickening) between 18 and 20 weeks of pregnancy. Follow these instructions at home:  Avoid all smoking, herbs, and alcohol. Avoid drugs not approved by your doctor.  Do not use any tobacco products, including cigarettes, chewing tobacco, and electronic cigarettes. If you need help quitting, ask your doctor. You may get counseling or other support to help you quit.  Only take medicine as told by your doctor. Some medicines are safe and some are not during pregnancy.  Exercise only as told by your doctor. Stop exercising if you start having cramps.  Eat regular, healthy meals.  Wear a good support bra if your breasts are tender.  Do not use hot tubs, steam rooms, or saunas.  Wear your seat belt when driving.  Avoid raw meat, uncooked cheese, and liter boxes and soil used by cats.  Take your prenatal vitamins.  Take 1500-2000 milligrams of calcium daily starting at the 20th week of pregnancy until you deliver your baby.  Try taking medicine that helps you poop (stool softener) as needed, and if your doctor approves. Eat more fiber by eating fresh fruit, vegetables, and whole grains. Drink enough fluids to keep your pee (urine) clear or pale yellow.  Take warm water baths (sitz baths) to soothe pain or discomfort caused by hemorrhoids. Use hemorrhoid cream if your doctor approves.  If you have puffy, bulging veins (varicose veins), wear support hose. Raise (elevate) your feet for 15 minutes, 3-4 times a day. Limit salt in your diet.  Avoid heavy  lifting, wear low heals, and sit up straight.  Rest with your legs raised if you have leg cramps or low back pain.  Visit your dentist if you have not gone during your pregnancy. Use a soft toothbrush to brush your teeth. Be gentle when you floss.  You can have sex (intercourse) unless your doctor tells you not to.  Go to your doctor visits. Get help if:  You feel dizzy.  You have mild cramps or pressure in your lower belly (abdomen).  You have a nagging pain in your belly area.  You continue to feel sick to your stomach (nauseous), throw up (vomit), or have watery poop (diarrhea).  You have bad smelling fluid coming from your vagina.  You have pain with peeing (urination). Get help right away if:  You have a fever.  You are leaking fluid from your vagina.  You have spotting or bleeding from your vagina.  You have severe belly cramping or pain.  You lose or gain weight rapidly.  You have trouble catching your breath and have chest pain.  You notice sudden or extreme puffiness (swelling) of your face, hands, ankles, feet, or legs.  You have not felt the baby move in over an hour.  You have severe headaches that do not go away with medicine.  You have vision changes. This information is not intended to replace advice given to you by your health care provider. Make sure you discuss any questions you have with your health care  provider. Document Released: 12/29/2009 Document Revised: 03/11/2016 Document Reviewed: 12/05/2012 Elsevier Interactive Patient Education  2017 [employer] Uterine Fibroids Uterine fibroids are tissue masses (tumors). They are also called leiomyomas. They can develop inside of a womans womb (uterus). They can grow very large. Fibroids are not cancerous (benign). Most fibroids do not require medical treatment. Follow these instructions at home:  Keep all follow-up visits as told by your doctor. This is important.  Take medicines only as told  by your doctor.  If you were prescribed a hormone treatment, take the hormone medicines exactly as told.  Do not take aspirin. It can cause bleeding.  Ask your doctor about taking iron pills and increasing the amount of dark green, leafy vegetables in your diet. These actions can help to boost your blood iron levels.  Pay close attention to your period. Tell your doctor about any changes, such as:  Increased blood flow. This may require you to use more pads or tampons than usual per month.  A change in the number of days that your period lasts per month.  A change in symptoms that come with your period, such as back pain or cramping in your belly area (abdomen). Contact a doctor if:  You have pain in your back or the area between your hip bones (pelvic area) that is not controlled by medicines.  You have pain in your abdomen that is not controlled with medicines.  You have an increase in bleeding between and during periods.  You soak tampons or pads in a half hour or less.  You feel lightheaded.  You feel extra tired.  You feel weak. Get help right away if:  You pass out (faint).  You have a sudden increase in pelvic pain. This information is not intended to replace advice given to you by your health care provider. Make sure you discuss any questions you have with your health care provider. Document Released: 11/06/2010 Document Revised: 06/04/2016 Document Reviewed: 04/02/2014 Elsevier Interactive Patient Education  2017 [employer].

## 2016-12-17 NOTE — MAU Note (Signed)
Pt received to MAU c/o lower abdominal pain which is radiating to her thighs. Pt states this started yesterday evening. Pt denies any vaginal bleeding. Toya Smothers, RN

## 2016-12-17 NOTE — MAU Provider Note (Signed)
Chief Complaint: Abdominal Pain   First Provider Initiated Contact with Patient 12/17/16 1107        SUBJECTIVE HPI: Karen Berg is a 31 y.o. G1P0 at [redacted]w[redacted]d by LMP who presents to maternity admissions reporting sharp pain in lower abdomen, mostly on sides.  Pain radiates down thighs.  Has known large fibroids, multiple.Marland Kitchen Recently had a consult with MFM regarding these.. She denies vaginal bleeding, vaginal itching/burning, urinary symptoms, h/a, dizziness, n/v, or fever/chills.     Abdominal Pain  This is a recurrent problem. The current episode started in the past 7 days. The onset quality is gradual. The problem occurs intermittently. The problem has been unchanged. The pain is located in the LLQ, RLQ and suprapubic region. The pain is moderate. The quality of the pain is cramping and sharp. Pain radiation: thighs. Pertinent negatives include no anorexia, constipation, diarrhea, dysuria, fever, frequency, myalgias, nausea or vomiting. The pain is aggravated by palpation and certain positions. The pain is relieved by nothing. She has tried nothing for the symptoms.   RN Note: Pt received to MAU c/o lower abdominal pain which is radiating to her thighs. Pt states this started yesterday evening. Pt denies any vaginal bleeding. TERRI L TAEUBER, RN Been having a lot of sharp pains in lower abd, now they are radiating into upper thighs.  Pains started last night.  Denies bleeding or d/c, no GI or GU complaints.  Past Medical History:  Diagnosis Date  . Eczema   . Family history of anesthesia complication    Mother had PONV  . Fibroids   . GERD (gastroesophageal reflux disease)   . Migraine    Past Surgical History:  Procedure Laterality Date  . CHOLECYSTECTOMY N/A 02/05/2014   Procedure: LAPAROSCOPIC CHOLECYSTECTOMY WITH INTRAOPERATIVE CHOLANGIOGRAM;  Surgeon: [name]. Georgette Dover, MD;  Location: [place];  Service: General;  Laterality: N/A;  . WISDOM TOOTH EXTRACTION     Social History    Social History  . Marital status: Single    Spouse name: N/A  . Number of children: N/A  . Years of education: N/A   Occupational History  . Not on file.   Social History Main Topics  . Smoking status: Never Smoker  . Smokeless tobacco: Never Used  . Alcohol use No     Comment: rarely  . Drug use: No  . Sexual activity: Yes   Other Topics Concern  . Not on file   Social History Narrative  . No narrative on file   No current facility-administered medications on file prior to encounter.    Current Outpatient Prescriptions on File Prior to Encounter  Medication Sig Dispense Refill  . Prenatal Vit w/Fe-Methylfol-FA (PNV PO) Take 1 tablet by mouth daily.     Marland Kitchen triamcinolone ointment (KENALOG) 0.1 % Apply 1 application topically 2 (two) times daily as needed (eczema).     No Known Allergies  I have reviewed patient's Past Medical Hx, Surgical Hx, Family Hx, Social Hx, medications and allergies.   ROS:  Review of Systems  Constitutional: Negative for fever.  Gastrointestinal: Positive for abdominal pain. Negative for anorexia, constipation, diarrhea, nausea and vomiting.  Genitourinary: Negative for dysuria and frequency.  Musculoskeletal: Negative for myalgias.   Review of Systems  Other systems negative   Physical Exam  Physical Exam Patient Vitals for the past 24 hrs:  BP Temp Pulse Resp SpO2 Weight  12/17/16 1033 128/88 98 F (36.7 C) 92 16 100 % 165 lb 4 oz (75 kg)  Constitutional: Well-developed, well-nourished female in no acute distress.  Cardiovascular: normal rate Respiratory: normal effort GI: Abd soft, non-tender. Pos BS x 4   Uterus is large, approximately 26 wk size, firm and irregular, c/w fibroid uterus.  More tender along bilateral round ligaments. MS: Extremities nontender, no edema, normal ROM Neurologic: Alert and oriented x 4.  GU: Neg CVAT.  PELVIC EXAM: deferred  FHT 147 by doppler  LAB RESULTS Results for orders placed or  performed during the hospital encounter of 12/17/16 (from the past 24 hour(s))  Urinalysis, Routine w reflex microscopic     Status: Abnormal   Collection Time: 12/17/16 10:34 AM  Result Value Ref Range   Color, Urine STRAW (A) YELLOW   APPearance CLEAR CLEAR   Specific Gravity, Urine 1.008 1.005 - 1.030   pH 7.0 5.0 - 8.0   Glucose, UA NEGATIVE NEGATIVE mg/dL   Hgb urine dipstick NEGATIVE NEGATIVE   Bilirubin Urine NEGATIVE NEGATIVE   Ketones, ur NEGATIVE NEGATIVE mg/dL   Protein, ur NEGATIVE NEGATIVE mg/dL   Nitrite NEGATIVE NEGATIVE   Leukocytes, UA NEGATIVE NEGATIVE    --/--/O POS (01/11 2145)  IMAGING [place] Mfm Ob Comp Less 14 Wks  Result Date: 11/30/2016 ----------------------------------------------------------------------  OBSTETRICS REPORT                      (Signed Final 11/30/2016 02:32 pm) ---------------------------------------------------------------------- Patient Info  ID #:       [id number]                          D.O.B.:  [birth year] (30 yrs)  Name:       [facility]              Visit Date: 11/30/2016 01:49 pm ---------------------------------------------------------------------- Performed By  Performed By:     Elisabeth Cara        Ref. Address:     [facility]                                                             Obstetrics &                                                             Gynecology                                                             [address]  [place], [place]  Attending:        Griffin Dakin MD         Location:         [facility]  Referred By:      Crawford Givens                    MD  ---------------------------------------------------------------------- Orders   #  Description                                 Code   1  [place] MFM OB COMP LESS THAN 14                 [phone]      WEEKS  ----------------------------------------------------------------------   #  Ordered By               Order #        Accession #    Episode #   1  Crawford Givens            [id number]      [id number]     [id number]  ---------------------------------------------------------------------- Indications   [redacted] weeks gestation of pregnancy                Z3A.12   Uterine fibroids affecting pregnancy in first  O34.11, D25.9   trimester, antepartum  ---------------------------------------------------------------------- OB History  Gravidity:    1 ---------------------------------------------------------------------- Fetal Evaluation  Num Of Fetuses:     1  Fetal Heart         153  Rate(bpm):  Cardiac Activity:   Observed  Presentation:       Cephalic  Placenta:           Posterior  Amniotic Fluid  AFI FV:      Subjectively within normal limits                              Largest Pocket(cm)                              4.9 ---------------------------------------------------------------------- Biometry  CRL:        70  mm     G. Age:  13w 0d  EDD:   06/07/17 ---------------------------------------------------------------------- Gestational Age  LMP:           12w 3d        Date:  09/04/16                 EDD:   06/11/17  Best:          12w 3d     Det. By:  LMP  (09/04/16)          EDD:   06/11/17 ---------------------------------------------------------------------- Anatomy  Choroid Plexus:        Appears normal         Upper Extremities:      Visualized  Stomach:               Appears normal         Lower Extremities:      Visualized  Bladder:               Appears normal ---------------------------------------------------------------------- Cervix Uterus Adnexa  Cervix  Normal appearance by transabdominal scan.   Uterus  Multiple fibroids noted, see table below.  Left Ovary  Not visualized.  Right Ovary  Not visualized.  Adnexa:       No abnormality visualized. No adnexal mass                visualized. ---------------------------------------------------------------------- Myomas   Site                     L(cm)      W(cm)      D(cm)      Location   Anterior                 8.4        9.1        6.5   LUS/Ant                  7.6        7.4        4.7   Posterior                7.5        8.8        6.5   Posterior                7.5        5.4        4.4  ----------------------------------------------------------------------   Blood Flow                 RI        PI       Comments  ---------------------------------------------------------------------- Impression  Singleton intrauterine pregnancy at 12+3 weeks, here for  uterine fiboids  Review of the anatomy  was limited by the early EGA, but  showed a normal 12-13 week fetus without  gross structural  anomalies  Amniotic fluid volume is normal  CRL confirmed menstrual dating  There are multiple uterine fibroids noted, all of which are  heterogenous in appearance. sizes are as noted above ---------------------------------------------------------------------- Recommendations  See MFM consult for full details. Follow up US planned in 4  weeks ----------------------------------------------------------------------                 Griffin Dakin, MD Electronically Signed Final Report   11/30/2016 02:32 pm ----------------------------------------------------------------------   MAU Management/MDM: Consulted Dr Rivard with presentation, history and exam findings.  She recommends trying Ibuprofen until 28-30 weeks for discomfort  Ibuprofen given with excellent relief of pain  ASSESSMENT Single IUP at [redacted]w[redacted]d Multiple large uterine fibroids Probable round ligament pain  PLAN Discharge home Comfort measures May benefit from pregnancy belt Rx Ibuprofen given for pain with  warning not to use beyond 28-30 weeks Followup in office as scheduled Pt stable at time of discharge. Encouraged to return here or to other Urgent Care/ED if she develops worsening of symptoms, increase in pain, fever, or other concerning symptoms.    Hansel Feinstein CNM, MSN Certified Nurse-Midwife 12/17/2016  11:45 AM

## 2016-12-17 NOTE — MAU Note (Signed)
Been having a lot of sharp pains in lower abd, now they are radiating into upper thighs.  Pains started last night.  Denies bleeding or d/c, no GI or GU complaints.

## 2016-12-28 ENCOUNTER — Encounter (HOSPITAL_COMMUNITY): Payer: Self-pay

## 2016-12-28 ENCOUNTER — Other Ambulatory Visit (HOSPITAL_COMMUNITY): Payer: Self-pay | Admitting: *Deleted

## 2016-12-28 ENCOUNTER — Ambulatory Visit (HOSPITAL_COMMUNITY)
Admission: RE | Admit: 2016-12-28 | Discharge: 2016-12-28 | Disposition: A | Discharge status: Home / Self Care | Payer: 59 | Source: Ambulatory Visit | Attending: Obstetrics and Gynecology | Admitting: Obstetrics and Gynecology

## 2016-12-28 DIAGNOSIS — O3410 Maternal care for benign tumor of corpus uteri, unspecified trimester: Secondary | ICD-10-CM

## 2016-12-28 DIAGNOSIS — D259 Leiomyoma of uterus, unspecified: Secondary | ICD-10-CM | POA: Diagnosis not present

## 2016-12-28 DIAGNOSIS — Z3A16 16 weeks gestation of pregnancy: Secondary | ICD-10-CM | POA: Insufficient documentation

## 2016-12-28 DIAGNOSIS — O3412 Maternal care for benign tumor of corpus uteri, second trimester: Secondary | ICD-10-CM | POA: Insufficient documentation

## 2017-01-18 ENCOUNTER — Encounter (HOSPITAL_COMMUNITY): Payer: Self-pay

## 2017-01-18 ENCOUNTER — Ambulatory Visit (HOSPITAL_COMMUNITY)
Admission: RE | Admit: 2017-01-18 | Discharge: 2017-01-18 | Disposition: A | Discharge status: Home / Self Care | Payer: 59 | Source: Ambulatory Visit | Attending: Obstetrics and Gynecology | Admitting: Obstetrics and Gynecology

## 2017-01-18 DIAGNOSIS — Z3A19 19 weeks gestation of pregnancy: Secondary | ICD-10-CM | POA: Insufficient documentation

## 2017-01-18 DIAGNOSIS — O3410 Maternal care for benign tumor of corpus uteri, unspecified trimester: Secondary | ICD-10-CM

## 2017-01-18 DIAGNOSIS — O3412 Maternal care for benign tumor of corpus uteri, second trimester: Secondary | ICD-10-CM | POA: Diagnosis not present

## 2017-01-18 DIAGNOSIS — Z3689 Encounter for other specified antenatal screening: Secondary | ICD-10-CM | POA: Insufficient documentation

## 2017-01-18 DIAGNOSIS — D259 Leiomyoma of uterus, unspecified: Secondary | ICD-10-CM | POA: Insufficient documentation

## 2017-04-26 ENCOUNTER — Other Ambulatory Visit (HOSPITAL_BASED_OUTPATIENT_CLINIC_OR_DEPARTMENT_OTHER): Payer: 59

## 2017-04-26 ENCOUNTER — Ambulatory Visit (HOSPITAL_BASED_OUTPATIENT_CLINIC_OR_DEPARTMENT_OTHER): Payer: 59 | Admitting: Hematology & Oncology

## 2017-04-26 ENCOUNTER — Ambulatory Visit: Payer: 59

## 2017-04-26 VITALS — BP 134/80 | HR 84 | Temp 98.3°F | Resp 17 | Wt 174.0 lb

## 2017-04-26 DIAGNOSIS — D500 Iron deficiency anemia secondary to blood loss (chronic): Secondary | ICD-10-CM

## 2017-04-26 DIAGNOSIS — O99113 Other diseases of the blood and blood-forming organs and certain disorders involving the immune mechanism complicating pregnancy, third trimester: Secondary | ICD-10-CM | POA: Diagnosis not present

## 2017-04-26 DIAGNOSIS — N920 Excessive and frequent menstruation with regular cycle: Secondary | ICD-10-CM

## 2017-04-26 LAB — CBC WITH DIFFERENTIAL (CANCER CENTER ONLY)
BASO#: 0 10*3/uL (ref 0.0–0.2)
BASO%: 0.2 % (ref 0.0–2.0)
EOS ABS: 0 10*3/uL (ref 0.0–0.5)
EOS%: 0.4 % (ref 0.0–7.0)
HCT: 26 % — ABNORMAL LOW (ref 34.8–46.6)
HEMOGLOBIN: 8.2 g/dL — AB (ref 11.6–15.9)
LYMPH#: 1.6 10*3/uL (ref 0.9–3.3)
LYMPH%: 35.1 % (ref 14.0–48.0)
MCH: 25.6 pg — AB (ref 26.0–34.0)
MCHC: 31.5 g/dL — AB (ref 32.0–36.0)
MCV: 81 fL (ref 81–101)
MONO#: 0.3 10*3/uL (ref 0.1–0.9)
MONO%: 7.6 % (ref 0.0–13.0)
NEUT%: 56.7 % (ref 39.6–80.0)
NEUTROS ABS: 2.5 10*3/uL (ref 1.5–6.5)
Platelets: 233 10*3/uL (ref 145–400)
RBC: 3.2 10*6/uL — ABNORMAL LOW (ref 3.70–5.32)
RDW: 15.2 % (ref 11.1–15.7)
WBC: 4.5 10*3/uL (ref 3.9–10.0)

## 2017-04-26 NOTE — Progress Notes (Signed)
Referral MD  Reason for Referral: Iron deficiency anemia; third trimester of her first pregnancy; menometrorrhagia secondary to fibroids   Chief Complaint  Patient presents with  . New Patient (Initial Visit)  : I cannot tolerate oral iron.  HPI: Karen Berg is a very charming 31 year old African-American female. She is in her third trimester of pregnancy. She is due on August 25. This is her first baby. We will be a baby boy.  She's been healthy. Prior to her pregnancy, she has had issues with menometrorrhagia. She has been followed by OB/GYN for this.  She cannot tolerate oral iron. She has tried oral iron. This is made her sick.  She does not have any sickle cell trait. There is no obvious thalassemia. She says there is some anemia on her mom's side of the family.  She does not smoke. She does not drink.  She works from home.  Her husband works for [employer]. He runs a Web designer on [place].  So far, her pregnancy it really has been uncomplicated. Her baby boy is doing quite well. His name will be Karen Berg.  She's had no problems with unusual weight gain. There's no leg swelling. She's had no nausea or vomiting. She had a little bit of hyperemesis gravidarum during the first trimester.  She has had no headache. She has had no bleeding.  Overall, her performance status is ECOG 0.   Past Medical History:  Diagnosis Date  . Eczema   . Family history of anesthesia complication    Mother had PONV  . Fibroids   . GERD (gastroesophageal reflux disease)   . Migraine   :  Past Surgical History:  Procedure Laterality Date  . CHOLECYSTECTOMY N/A 02/05/2014   Procedure: LAPAROSCOPIC CHOLECYSTECTOMY WITH INTRAOPERATIVE CHOLANGIOGRAM;  Surgeon: [name]. Georgette Dover, MD;  Location: [place];  Service: General;  Laterality: N/A;  . WISDOM TOOTH EXTRACTION    :   Current Outpatient Prescriptions:  .  Prenatal Vit w/Fe-Methylfol-FA (PNV PO), Take 1 tablet by mouth daily. ,  Disp: , Rfl:  .  triamcinolone ointment (KENALOG) 0.1 %, Apply 1 application topically 2 (two) times daily as needed (eczema)., Disp: , Rfl: :  :  Allergies  Allergen Reactions  . No Known Allergies   :  Family History  Problem Relation Age of Onset  . Diabetes Other   . Cancer - Prostate Other   . Cancer - Lung Other   :  Social History   Social History  . Marital status: Single    Spouse name: N/A  . Number of children: N/A  . Years of education: N/A   Occupational History  . Not on file.   Social History Main Topics  . Smoking status: Never Smoker  . Smokeless tobacco: Never Used  . Alcohol use No     Comment: rarely  . Drug use: No  . Sexual activity: Yes    Birth control/ protection: None   Other Topics Concern  . Not on file   Social History Narrative  . No narrative on file  :  Pertinent items are noted in HPI.  Exam:  Pregnant African-American female in no obvious distress. Vital signs show a temperature of 98.3. Pulse 84. Blood pressure 134/80. Weight is 174 pounds. Head and neck exam shows no ocular or oral lesions. There are no palpable cervical or supraclavicular lymph nodes. Lungs are clear bilaterally. Cardiac exam regular rate and rhythm with no murmurs, rubs or bruits. Abdomen is  pregnant. Her baby feels as if the head is down. There is no fluid. There is no liver or spleen tip. Back exam shows no tenderness over the spine, ribs or hips. Extremities shows no clubbing, cyanosis or edema. Neurological exam shows no focal neurological deficits. Skin exam shows no rashes, ecchymoses or petechia.    Recent Labs  04/26/17 1105  WBC 4.5  HGB 8.2*  HCT 26.0*  PLT 233   No results for input(s): NA, K, CL, CO2, GLUCOSE, BUN, CREATININE, CALCIUM in the last 72 hours.  Blood smear review:  Microcytic population of red blood cells. There are no nucleated red blood cells. I see no teardrop cells. There are no inclusion bodies. I see no target cells. There  is no rouleau formation. White cells been normal in morphology maturation. There are no immature myeloid or lymphoid forms. Platelets are adequate in number and size.  Pathology: None     Assessment and Plan:  Karen Berg is a very charming 31 year old African-American female. This is her first pregnancy. She is iron deficient from what I can tell by her blood smear.  We have to get IV iron and do her. She is not tolerant of oral iron.  We will have to do the IV iron at [facility] baby will not be on a monitor. I don't think this should be a problem.  Right now, I will like to see her back in 3 or 4 weeks. She is due in about 6 weeks. I will like to make sure that we get her back so that we can check her blood count.  It would be nice if her hemoglobin was above 10 when she went for delivery.

## 2017-04-27 ENCOUNTER — Other Ambulatory Visit: Payer: Self-pay | Admitting: *Deleted

## 2017-04-27 ENCOUNTER — Telehealth: Payer: Self-pay | Admitting: *Deleted

## 2017-04-27 DIAGNOSIS — D500 Iron deficiency anemia secondary to blood loss (chronic): Secondary | ICD-10-CM

## 2017-04-27 LAB — IRON AND TIBC
%SAT: 5 % — AB (ref 21–57)
Iron: 24 ug/dL — ABNORMAL LOW (ref 41–142)
TIBC: 468 ug/dL — ABNORMAL HIGH (ref 236–444)
UIBC: 443 ug/dL — ABNORMAL HIGH (ref 120–384)

## 2017-04-27 LAB — HEMOGLOBINOPATHY EVALUATION
HGB C: 0 %
HGB S: 0 %
HGB VARIANT: 0 %
Hemoglobin A2 Quantitation: 1.7 % — ABNORMAL LOW (ref 1.8–3.2)
Hemoglobin F Quantitation: 0 % (ref 0.0–2.0)
Hgb A: 98.3 % (ref 96.4–98.8)

## 2017-04-27 LAB — FERRITIN: Ferritin: 5 ng/ml — ABNORMAL LOW (ref 9–269)

## 2017-04-27 LAB — RETICULOCYTES: Reticulocyte Count: 2 % (ref 0.6–2.6)

## 2017-04-27 NOTE — Telephone Encounter (Signed)
Patient needs two feraheme infusions. She is in her third trimester of pregnancy and will need these to be done at [facility] per Dr Marin Olp.  Spoke to [employer] at [facility] admissions at [facility]. Patient will need to come in, preferably in the am, this Friday for her first dose, and then her second dose next Friday, July 20th. Due to the way the procedures work at MAU, patient cannot be scheduled, but rather just needs to come in and wait to be seen.   Spoke with patient and she understands that she needs to go to MAU this Friday and next. She is famillar with the location. She also understands that an appointment can't be made, and depending on the activity in the MAU she may need to wait to be seen. She understands that the earlier in the day that she arrives, the less likely she is to wait.  Orders placed in sign and held

## 2017-04-29 ENCOUNTER — Inpatient Hospital Stay (HOSPITAL_COMMUNITY)
Admission: AD | Admit: 2017-04-29 | Discharge: 2017-04-29 | Disposition: A | Discharge status: Home / Self Care | Payer: 59 | Source: Ambulatory Visit | Attending: Obstetrics and Gynecology | Admitting: Obstetrics and Gynecology

## 2017-04-29 ENCOUNTER — Encounter (HOSPITAL_COMMUNITY): Payer: Self-pay | Admitting: *Deleted

## 2017-04-29 DIAGNOSIS — O26893 Other specified pregnancy related conditions, third trimester: Secondary | ICD-10-CM | POA: Diagnosis not present

## 2017-04-29 DIAGNOSIS — R030 Elevated blood-pressure reading, without diagnosis of hypertension: Secondary | ICD-10-CM | POA: Diagnosis not present

## 2017-04-29 DIAGNOSIS — D500 Iron deficiency anemia secondary to blood loss (chronic): Secondary | ICD-10-CM

## 2017-04-29 DIAGNOSIS — D509 Iron deficiency anemia, unspecified: Secondary | ICD-10-CM

## 2017-04-29 DIAGNOSIS — O99013 Anemia complicating pregnancy, third trimester: Secondary | ICD-10-CM | POA: Diagnosis present

## 2017-04-29 DIAGNOSIS — Z3A34 34 weeks gestation of pregnancy: Secondary | ICD-10-CM | POA: Insufficient documentation

## 2017-04-29 DIAGNOSIS — O99019 Anemia complicating pregnancy, unspecified trimester: Secondary | ICD-10-CM

## 2017-04-29 LAB — COMPREHENSIVE METABOLIC PANEL
ALT: 38 U/L (ref 14–54)
AST: 32 U/L (ref 15–41)
Albumin: 3.1 g/dL — ABNORMAL LOW (ref 3.5–5.0)
Alkaline Phosphatase: 94 U/L (ref 38–126)
Anion gap: 9 (ref 5–15)
BUN: 5 mg/dL — ABNORMAL LOW (ref 6–20)
CO2: 23 mmol/L (ref 22–32)
Calcium: 8.6 mg/dL — ABNORMAL LOW (ref 8.9–10.3)
Chloride: 104 mmol/L (ref 101–111)
Creatinine, Ser: 0.68 mg/dL (ref 0.44–1.00)
GFR calc Af Amer: >60 mL/min (ref >60)
GFR calc non Af Amer: >60 mL/min (ref >60)
Glucose, Bld: 95 mg/dL (ref 65–99)
Potassium: 2.8 mmol/L — ABNORMAL LOW (ref 3.5–5.1)
Sodium: 136 mmol/L (ref 135–145)
Total Bilirubin: 0.7 mg/dL (ref 0.3–1.2)
Total Protein: 6.5 g/dL (ref 6.5–8.1)

## 2017-04-29 LAB — CBC
HCT: 26.1 % — ABNORMAL LOW (ref 36.0–46.0)
Hemoglobin: 8.2 g/dL — ABNORMAL LOW (ref 12.0–15.0)
MCH: 25 pg — ABNORMAL LOW (ref 26.0–34.0)
MCHC: 31.4 g/dL (ref 30.0–36.0)
MCV: 79.6 fL (ref 78.0–100.0)
Platelets: 207 10*3/uL (ref 150–400)
RBC: 3.28 MIL/uL — ABNORMAL LOW (ref 3.87–5.11)
RDW: 16.1 % — ABNORMAL HIGH (ref 11.5–15.5)
WBC: 3.6 10*3/uL — ABNORMAL LOW (ref 4.0–10.5)

## 2017-04-29 LAB — PROTEIN / CREATININE RATIO, URINE
Creatinine, Urine: 121 mg/dL
Protein Creatinine Ratio: 0.21 mg/mg{Cre} — ABNORMAL HIGH (ref 0.00–0.15)
Total Protein, Urine: 25 mg/dL

## 2017-04-29 MED ORDER — SODIUM CHLORIDE 0.9 % IV SOLN
510.0000 mg | Freq: Once | INTRAVENOUS | Status: AC
  Administered 2017-04-29: 510 mg via INTRAVENOUS
  Filled 2017-04-29: qty 17

## 2017-04-29 MED ORDER — SODIUM CHLORIDE 0.9 % IV SOLN
INTRAVENOUS | Status: DC
  Administered 2017-04-29: 10:00:00 via INTRAVENOUS

## 2017-04-29 NOTE — Discharge Instructions (Signed)
Fetal Movement Counts Patient Name: ________________________________________________ Patient Due Date: ____________________ What is a fetal movement count? A fetal movement count is the number of times that you feel your baby move during a certain amount of time. This may also be called a fetal kick count. A fetal movement count is recommended for every pregnant woman. You may be asked to start counting fetal movements as early as week 28 of your pregnancy. Pay attention to when your baby is most active. You may notice your baby's sleep and wake cycles. You may also notice things that make your baby move more. You should do a fetal movement count:  When your baby is normally most active.  At the same time each day.  A good time to count movements is while you are resting, after having something to eat and drink. How do I count fetal movements? 1. Find a quiet, comfortable area. Sit, or lie down on your side. 2. Write down the date, the start time and stop time, and the number of movements that you felt between those two times. Take this information with you to your health care visits. 3. For 2 hours, count kicks, flutters, swishes, rolls, and jabs. You should feel at least 10 movements during 2 hours. 4. You may stop counting after you have felt 10 movements. 5. If you do not feel 10 movements in 2 hours, have something to eat and drink. Then, keep resting and counting for 1 hour. If you feel at least 4 movements during that hour, you may stop counting. Contact a health care provider if:  You feel fewer than 4 movements in 2 hours.  Your baby is not moving like he or she usually does. Date: ____________ Start time: ____________ Stop time: ____________ Movements: ____________ Date: ____________ Start time: ____________ Stop time: ____________ Movements: ____________ Date: ____________ Start time: ____________ Stop time: ____________ Movements: ____________ Date: ____________ Start time:  ____________ Stop time: ____________ Movements: ____________ Date: ____________ Start time: ____________ Stop time: ____________ Movements: ____________ Date: ____________ Start time: ____________ Stop time: ____________ Movements: ____________ Date: ____________ Start time: ____________ Stop time: ____________ Movements: ____________ Date: ____________ Start time: ____________ Stop time: ____________ Movements: ____________ Date: ____________ Start time: ____________ Stop time: ____________ Movements: ____________ This information is not intended to replace advice given to you by your health care provider. Make sure you discuss any questions you have with your health care provider. Document Released: 11/03/2006 Document Revised: 06/02/2016 Document Reviewed: 11/13/2015 Elsevier Interactive Patient Education  2018 Elsevier [employer] Preeclampsia and Eclampsia Preeclampsia is a serious condition that develops only during pregnancy. It is also called toxemia of pregnancy. This condition causes high blood pressure along with other symptoms, such as swelling and headaches. These symptoms may develop as the condition gets worse. Preeclampsia may occur at 20 weeks of pregnancy or later. Diagnosing and treating preeclampsia early is very important. If not treated early, it can cause serious problems for you and your baby. One problem it can lead to is eclampsia, which is a condition that causes muscle jerking or shaking (convulsions or seizures) in the mother. Delivering your baby is the best treatment for preeclampsia or eclampsia. Preeclampsia and eclampsia symptoms usually go away after your baby is born. What are the causes? The cause of preeclampsia is not known. What increases the risk? The following risk factors make you more likely to develop preeclampsia:  Being pregnant for the first time.  Having had preeclampsia during a past pregnancy.  Having a family history   of preeclampsia.  Having high  blood pressure.  Being pregnant with twins or triplets.  Being 35 or older.  Being African-American.  Having kidney disease or diabetes.  Having medical conditions such as lupus or blood diseases.  Being very overweight (obese).  What are the signs or symptoms? The earliest signs of preeclampsia are:  High blood pressure.  Increased protein in your urine. Your health care provider will check for this at every visit before you give birth (prenatal visit).  Other symptoms that may develop as the condition gets worse include:  Severe headaches.  Sudden weight gain.  Swelling of the hands, face, legs, and feet.  Nausea and vomiting.  Vision problems, such as blurred or double vision.  Numbness in the face, arms, legs, and feet.  Urinating less than usual.  Dizziness.  Slurred speech.  Abdominal pain, especially upper abdominal pain.  Convulsions or seizures.  Symptoms generally go away after giving birth. How is this diagnosed? There are no screening tests for preeclampsia. Your health care provider will ask you about symptoms and check for signs of preeclampsia during your prenatal visits. You may also have tests that include:  Urine tests.  Blood tests.  Checking your blood pressure.  Monitoring your baby's heart rate.  Ultrasound.  How is this treated? You and your health care provider will determine the treatment approach that is best for you. Treatment may include:  Having more frequent prenatal exams to check for signs of preeclampsia, if you have an increased risk for preeclampsia.  Bed rest.  Reducing how much salt (sodium) you eat.  Medicine to lower your blood pressure.  Staying in the hospital, if your condition is severe. There, treatment will focus on controlling your blood pressure and the amount of fluids in your body (fluid retention).  You may need to take medicine (magnesium sulfate) to prevent seizures. This medicine may be given  as an injection or through an IV tube.  Delivering your baby early, if your condition gets worse. You may have your labor started with medicine (induced), or you may have a cesarean delivery.  Follow these instructions at home: Eating and drinking   Drink enough fluid to keep your urine clear or pale yellow.  Eat a healthy diet that is low in sodium. Do not add salt to your food. Check nutrition labels to see how much sodium a food or beverage contains.  Avoid caffeine. Lifestyle  Do not use any products that contain nicotine or tobacco, such as cigarettes and e-cigarettes. If you need help quitting, ask your health care provider.  Do not use alcohol or drugs.  Avoid stress as much as possible. Rest and get plenty of sleep. General instructions  Take over-the-counter and prescription medicines only as told by your health care provider.  When lying down, lie on your side. This keeps pressure off of your baby.  When sitting or lying down, raise (elevate) your feet. Try putting some pillows underneath your lower legs.  Exercise regularly. Ask your health care provider what kinds of exercise are best for you.  Keep all follow-up and prenatal visits as told by your health care provider. This is important. How is this prevented? To prevent preeclampsia or eclampsia from developing during another pregnancy:  Get proper medical care during pregnancy. Your health care provider may be able to prevent preeclampsia or diagnose and treat it early.  Your health care provider may have you take a low-dose aspirin or a calcium supplement during   your next pregnancy.  You may have tests of your blood pressure and kidney function after giving birth.  Maintain a healthy weight. Ask your health care provider for help managing weight gain during pregnancy.  Work with your health care provider to manage any long-term (chronic) health conditions you have, such as diabetes or kidney  problems.  Contact a health care provider if:  You gain more weight than expected.  You have headaches.  You have nausea or vomiting.  You have abdominal pain.  You feel dizzy or light-headed. Get help right away if:  You develop sudden or severe swelling anywhere in your body. This usually happens in the legs.  You gain 5 lbs (2.3 kg) or more during one week.  You have severe: ? Abdominal pain. ? Headaches. ? Dizziness. ? Vision problems. ? Confusion. ? Nausea or vomiting.  You have a seizure.  You have trouble moving any part of your body.  You develop numbness in any part of your body.  You have trouble speaking.  You have any abnormal bleeding.  You pass out. This information is not intended to replace advice given to you by your health care provider. Make sure you discuss any questions you have with your health care provider. Document Released: 10/01/2000 Document Revised: 06/01/2016 Document Reviewed: 05/10/2016 Elsevier Interactive Patient Education  2018 Elsevier Inc.  

## 2017-04-29 NOTE — MAU Note (Signed)
Pt presents to MAU for iron infusion. Pt denies any pain, vaginal bleeding or LOF

## 2017-04-29 NOTE — MAU Provider Note (Signed)
Karen Berg 31 y.o. G1P0 @ [redacted]w[redacted]d History     CSN: [id number]  Arrival date & time 04/29/17  0924   None     Chief Complaint  Patient presents with  . iron infusion    Pt is here for iron infusion and had some elevated B/P's   BP (!) 107/91   Pulse (!) 113   LMP 09/04/2016   Past Medical History:  Diagnosis Date  . Eczema   . Family history of anesthesia complication    Mother had PONV  . Fibroids   . GERD (gastroesophageal reflux disease)   . Migraine     Past Surgical History:  Procedure Laterality Date  . CHOLECYSTECTOMY N/A 02/05/2014   Procedure: LAPAROSCOPIC CHOLECYSTECTOMY WITH INTRAOPERATIVE CHOLANGIOGRAM;  Surgeon: [name]. Georgette Dover, MD;  Location: [place] [facility];  Service: General;  Laterality: N/A;  . WISDOM TOOTH EXTRACTION      Family History  Problem Relation Age of Onset  . Diabetes Other   . Cancer - Prostate Other   . Cancer - Lung Other     Social History  Substance Use Topics  . Smoking status: Never Smoker  . Smokeless tobacco: Never Used  . Alcohol use No     Comment: rarely    OB History    Gravida Para Term Preterm AB Living   1         0   SAB TAB Ectopic Multiple Live Births                  Review of Systems  Neurological: Negative for headaches.  All other systems reviewed and are negative.   Allergies  No known allergies  Home Medications    BP (!) 107/91   Pulse (!) 113   LMP 09/04/2016   Physical Exam  Constitutional: She is oriented to person, place, and time. She appears well-developed and well-nourished. No distress.  HENT:  Head: Normocephalic and atraumatic.  Neck: Normal range of motion.  Cardiovascular: Normal rate and regular rhythm.   Pulmonary/Chest: Effort normal and breath sounds normal. No respiratory distress.  Musculoskeletal: Normal range of motion.  Neurological: She is alert and oriented to person, place, and time.  Skin: Skin is warm and dry.  Psychiatric: She has a normal mood and affect.  Her behavior is normal.  Nursing note and vitals reviewed.   MAU Course  Procedures (including critical care time)  Labs Reviewed  CBC - Abnormal; Notable for the following:       Result Value   WBC 3.6 (*)    RBC 3.28 (*)    Hemoglobin 8.2 (*)    HCT 26.1 (*)    MCH 25.0 (*)    RDW 16.1 (*)    All other components within normal limits  COMPREHENSIVE METABOLIC PANEL - Abnormal; Notable for the following:    Potassium 2.8 (*)    BUN 5 (*)    Calcium 8.6 (*)    Albumin 3.1 (*)    All other components within normal limits  PROTEIN / CREATININE RATIO, URINE - Abnormal; Notable for the following:    Protein Creatinine Ratio 0.21 (*)    All other components within normal limits   No results found.   1. Iron deficiency during pregancy 2. Elevated B/p's   MDM  Iron infusion in; Preeclampsia labs normal; denies headache, epigastric pain or visual disturbances. FHR- Category 1 .discharge with understanding to return to the office on Monday for blood pressure check. Preeclampsia  precautions    Yvonne Kendall CNM 04/29/17 @ 1145am

## 2017-05-06 ENCOUNTER — Encounter (HOSPITAL_COMMUNITY): Payer: Self-pay

## 2017-05-06 ENCOUNTER — Inpatient Hospital Stay (HOSPITAL_COMMUNITY)
Admission: AD | Admit: 2017-05-06 | Discharge: 2017-05-06 | Disposition: A | Discharge status: Home / Self Care | Payer: 59 | Source: Ambulatory Visit | Attending: Obstetrics and Gynecology | Admitting: Obstetrics and Gynecology

## 2017-05-06 DIAGNOSIS — D500 Iron deficiency anemia secondary to blood loss (chronic): Secondary | ICD-10-CM

## 2017-05-06 DIAGNOSIS — O99013 Anemia complicating pregnancy, third trimester: Secondary | ICD-10-CM | POA: Diagnosis not present

## 2017-05-06 DIAGNOSIS — Z3A34 34 weeks gestation of pregnancy: Secondary | ICD-10-CM | POA: Insufficient documentation

## 2017-05-06 DIAGNOSIS — D509 Iron deficiency anemia, unspecified: Secondary | ICD-10-CM | POA: Insufficient documentation

## 2017-05-06 MED ORDER — SODIUM CHLORIDE 0.9 % IV SOLN
510.0000 mg | Freq: Once | INTRAVENOUS | Status: AC
  Administered 2017-05-06: 510 mg via INTRAVENOUS
  Filled 2017-05-06: qty 17

## 2017-05-06 MED ORDER — SODIUM CHLORIDE 0.9 % IV SOLN
INTRAVENOUS | Status: DC
  Administered 2017-05-06: 10:00:00 via INTRAVENOUS

## 2017-05-06 NOTE — MAU Note (Signed)
Patient presents for iron infusion, denies any complaints.

## 2017-05-11 ENCOUNTER — Other Ambulatory Visit: Payer: Self-pay | Admitting: Obstetrics and Gynecology

## 2017-05-16 ENCOUNTER — Telehealth: Payer: Self-pay | Admitting: *Deleted

## 2017-05-16 NOTE — Telephone Encounter (Signed)
Received call from patient stating that she had her IV iron at [facility] and that it went well.  Dr. Marin Olp wanted to follow up in 3-4 weeks with labwork. Patient asked if she could get her labwork at her [facility] doctors office.  THis is fine with Dr. Marin Olp. Patient has appointment with her [facility] doctor tomorrow

## 2017-05-17 LAB — OB RESULTS CONSOLE GBS: STREP GROUP B AG: POSITIVE

## 2017-06-02 ENCOUNTER — Encounter: Payer: Self-pay | Admitting: Hematology & Oncology

## 2017-06-03 ENCOUNTER — Encounter: Payer: Self-pay | Admitting: Hematology & Oncology

## 2017-06-09 ENCOUNTER — Inpatient Hospital Stay (HOSPITAL_COMMUNITY): Payer: 59 | Admitting: Anesthesiology

## 2017-06-09 ENCOUNTER — Encounter (HOSPITAL_COMMUNITY)
Admission: AD | Disposition: A | Discharge status: Home / Self Care | Payer: Self-pay | Source: Ambulatory Visit | Attending: Obstetrics and Gynecology

## 2017-06-09 ENCOUNTER — Inpatient Hospital Stay (HOSPITAL_COMMUNITY)
Admission: AD | Admit: 2017-06-09 | Discharge: 2017-06-12 | DRG: 766 | Disposition: A | Discharge status: Home / Self Care | Payer: 59 | Source: Ambulatory Visit | Attending: Obstetrics and Gynecology | Admitting: Obstetrics and Gynecology

## 2017-06-09 ENCOUNTER — Encounter (HOSPITAL_COMMUNITY): Payer: Self-pay

## 2017-06-09 DIAGNOSIS — D259 Leiomyoma of uterus, unspecified: Secondary | ICD-10-CM | POA: Diagnosis present

## 2017-06-09 DIAGNOSIS — O3410 Maternal care for benign tumor of corpus uteri, unspecified trimester: Secondary | ICD-10-CM

## 2017-06-09 DIAGNOSIS — O3413 Maternal care for benign tumor of corpus uteri, third trimester: Secondary | ICD-10-CM | POA: Diagnosis present

## 2017-06-09 DIAGNOSIS — Z3493 Encounter for supervision of normal pregnancy, unspecified, third trimester: Secondary | ICD-10-CM | POA: Diagnosis present

## 2017-06-09 DIAGNOSIS — O9902 Anemia complicating childbirth: Principal | ICD-10-CM | POA: Diagnosis present

## 2017-06-09 DIAGNOSIS — D500 Iron deficiency anemia secondary to blood loss (chronic): Secondary | ICD-10-CM | POA: Diagnosis present

## 2017-06-09 DIAGNOSIS — Z98891 History of uterine scar from previous surgery: Secondary | ICD-10-CM

## 2017-06-09 DIAGNOSIS — L309 Dermatitis, unspecified: Secondary | ICD-10-CM | POA: Diagnosis present

## 2017-06-09 DIAGNOSIS — Z3A39 39 weeks gestation of pregnancy: Secondary | ICD-10-CM

## 2017-06-09 DIAGNOSIS — G43119 Migraine with aura, intractable, without status migrainosus: Secondary | ICD-10-CM | POA: Diagnosis present

## 2017-06-09 LAB — COMPREHENSIVE METABOLIC PANEL
ALT: 27 U/L (ref 14–54)
AST: 39 U/L (ref 15–41)
Albumin: 3.6 g/dL (ref 3.5–5.0)
Alkaline Phosphatase: 122 U/L (ref 38–126)
Anion gap: 15 (ref 5–15)
BUN: 5 mg/dL — ABNORMAL LOW (ref 6–20)
CALCIUM: 9.4 mg/dL (ref 8.9–10.3)
CO2: 19 mmol/L — ABNORMAL LOW (ref 22–32)
Chloride: 102 mmol/L (ref 101–111)
Creatinine, Ser: 0.71 mg/dL (ref 0.44–1.00)
GFR calc Af Amer: >60 mL/min (ref >60)
GFR calc non Af Amer: >60 mL/min (ref >60)
Glucose, Bld: 96 mg/dL (ref 65–99)
Potassium: 2.9 mmol/L — ABNORMAL LOW (ref 3.5–5.1)
SODIUM: 136 mmol/L (ref 135–145)
TOTAL PROTEIN: 7.4 g/dL (ref 6.5–8.1)
Total Bilirubin: 0.6 mg/dL (ref 0.3–1.2)

## 2017-06-09 LAB — CBC
HCT: 37.7 % (ref 36.0–46.0)
Hemoglobin: 12.9 g/dL (ref 12.0–15.0)
MCH: 28.7 pg (ref 26.0–34.0)
MCHC: 34.2 g/dL (ref 30.0–36.0)
MCV: 84 fL (ref 78.0–100.0)
PLATELETS: 187 10*3/uL (ref 150–400)
RBC: 4.49 MIL/uL (ref 3.87–5.11)
RDW: 21.8 % — ABNORMAL HIGH (ref 11.5–15.5)
WBC: 8 10*3/uL (ref 4.0–10.5)

## 2017-06-09 LAB — TYPE AND SCREEN
ABO/RH(D): O POS
ANTIBODY SCREEN: NEGATIVE

## 2017-06-09 LAB — LACTATE DEHYDROGENASE: LDH: 181 U/L (ref 98–192)

## 2017-06-09 SURGERY — Surgical Case
Anesthesia: General

## 2017-06-09 MED ORDER — OXYCODONE HCL 5 MG PO TABS: 5.0000 mg | ORAL_TABLET | Freq: Once | ORAL | Status: DC | PRN

## 2017-06-09 MED ORDER — LACTATED RINGERS IV SOLN: 500.0000 mL | Freq: Once | INTRAVENOUS | Status: DC

## 2017-06-09 MED ORDER — ACETAMINOPHEN 325 MG PO TABS: 650.0000 mg | ORAL_TABLET | ORAL | Status: DC | PRN

## 2017-06-09 MED ORDER — OXYTOCIN 40 UNITS IN LACTATED RINGERS INFUSION - SIMPLE MED: 2.5000 [IU]/h | INTRAVENOUS | Status: DC

## 2017-06-09 MED ORDER — LIDOCAINE HCL 1 % IJ SOLN
INTRAMUSCULAR | Status: AC
  Filled 2017-06-09: qty 20

## 2017-06-09 MED ORDER — PROPOFOL 10 MG/ML IV BOLUS
INTRAVENOUS | Status: AC
  Filled 2017-06-09: qty 20

## 2017-06-09 MED ORDER — FLEET ENEMA 7-19 GM/118ML RE ENEM: 1.0000 | ENEMA | RECTAL | Status: DC | PRN

## 2017-06-09 MED ORDER — MENTHOL 3 MG MT LOZG: 1.0000 | LOZENGE | OROMUCOSAL | Status: DC | PRN

## 2017-06-09 MED ORDER — SODIUM CHLORIDE 0.9 % IV SOLN: 2.0000 g | Freq: Once | INTRAVENOUS | Status: DC

## 2017-06-09 MED ORDER — TERBUTALINE SULFATE 1 MG/ML IJ SOLN
INTRAMUSCULAR | Status: AC
  Administered 2017-06-09: 0.25 mg
  Filled 2017-06-09: qty 1

## 2017-06-09 MED ORDER — OXYTOCIN 10 UNIT/ML IJ SOLN
INTRAMUSCULAR | Status: AC
  Filled 2017-06-09: qty 4

## 2017-06-09 MED ORDER — COCONUT OIL OIL
1.0000 "application " | TOPICAL_OIL | Status: DC | PRN
  Administered 2017-06-11: 1 via TOPICAL
  Filled 2017-06-09: qty 120

## 2017-06-09 MED ORDER — OXYTOCIN 40 UNITS IN LACTATED RINGERS INFUSION - SIMPLE MED: 2.5000 [IU]/h | INTRAVENOUS | Status: AC

## 2017-06-09 MED ORDER — OXYCODONE-ACETAMINOPHEN 5-325 MG PO TABS
2.0000 | ORAL_TABLET | ORAL | Status: DC | PRN
Start: 2017-06-09 — End: 2017-06-09

## 2017-06-09 MED ORDER — DEXAMETHASONE SODIUM PHOSPHATE 4 MG/ML IJ SOLN
INTRAMUSCULAR | Status: DC | PRN
  Administered 2017-06-09: 4 mg via INTRAVENOUS

## 2017-06-09 MED ORDER — HYDROMORPHONE HCL 1 MG/ML IJ SOLN
INTRAMUSCULAR | Status: AC
  Filled 2017-06-09: qty 1

## 2017-06-09 MED ORDER — PROMETHAZINE HCL 25 MG/ML IJ SOLN: 6.2500 mg | INTRAMUSCULAR | Status: DC | PRN

## 2017-06-09 MED ORDER — OXYCODONE-ACETAMINOPHEN 5-325 MG PO TABS
2.0000 | ORAL_TABLET | ORAL | Status: DC | PRN
  Administered 2017-06-10 (×3): 2 via ORAL
  Filled 2017-06-09 (×3): qty 2

## 2017-06-09 MED ORDER — HYDROMORPHONE HCL 1 MG/ML IJ SOLN
INTRAMUSCULAR | Status: AC
  Filled 2017-06-09: qty 0.5

## 2017-06-09 MED ORDER — DEXAMETHASONE SODIUM PHOSPHATE 4 MG/ML IJ SOLN
INTRAMUSCULAR | Status: AC
  Filled 2017-06-09: qty 1

## 2017-06-09 MED ORDER — FENTANYL 2.5 MCG/ML BUPIVACAINE 1/10 % EPIDURAL INFUSION (WH - ANES)
INTRAMUSCULAR | Status: AC
  Filled 2017-06-09: qty 100

## 2017-06-09 MED ORDER — SENNOSIDES-DOCUSATE SODIUM 8.6-50 MG PO TABS
2.0000 | ORAL_TABLET | ORAL | Status: DC
  Administered 2017-06-10 – 2017-06-12 (×3): 2 via ORAL
  Filled 2017-06-09 (×3): qty 2

## 2017-06-09 MED ORDER — HYDROMORPHONE HCL 1 MG/ML IJ SOLN
0.2500 mg | INTRAMUSCULAR | Status: DC | PRN
  Administered 2017-06-09: 0.5 mg via INTRAVENOUS
  Administered 2017-06-09 (×2): 0.25 mg via INTRAVENOUS
  Administered 2017-06-09: 0.5 mg via INTRAVENOUS

## 2017-06-09 MED ORDER — OXYTOCIN BOLUS FROM INFUSION: 500.0000 mL | Freq: Once | INTRAVENOUS | Status: DC

## 2017-06-09 MED ORDER — PROPOFOL 10 MG/ML IV BOLUS
INTRAVENOUS | Status: DC | PRN
  Administered 2017-06-09: 180 mg via INTRAVENOUS

## 2017-06-09 MED ORDER — ACETAMINOPHEN 10 MG/ML IV SOLN
1000.0000 mg | Freq: Once | INTRAVENOUS | Status: AC
  Administered 2017-06-09: 1000 mg via INTRAVENOUS

## 2017-06-09 MED ORDER — OXYCODONE-ACETAMINOPHEN 5-325 MG PO TABS
1.0000 | ORAL_TABLET | ORAL | Status: DC | PRN
  Administered 2017-06-09 – 2017-06-12 (×6): 1 via ORAL
  Filled 2017-06-09 (×6): qty 1

## 2017-06-09 MED ORDER — SIMETHICONE 80 MG PO CHEW
80.0000 mg | CHEWABLE_TABLET | ORAL | Status: DC
  Administered 2017-06-10 – 2017-06-12 (×3): 80 mg via ORAL
  Filled 2017-06-09 (×3): qty 1

## 2017-06-09 MED ORDER — PHENYLEPHRINE 40 MCG/ML (10ML) SYRINGE FOR IV PUSH (FOR BLOOD PRESSURE SUPPORT)
PREFILLED_SYRINGE | INTRAVENOUS | Status: AC
  Filled 2017-06-09: qty 10

## 2017-06-09 MED ORDER — OXYCODONE HCL 5 MG/5ML PO SOLN: 5.0000 mg | Freq: Once | ORAL | Status: DC | PRN

## 2017-06-09 MED ORDER — SOD CITRATE-CITRIC ACID 500-334 MG/5ML PO SOLN
30.0000 mL | ORAL | Status: DC | PRN
  Administered 2017-06-09: 30 mL via ORAL
  Filled 2017-06-09: qty 15

## 2017-06-09 MED ORDER — FENTANYL CITRATE (PF) 100 MCG/2ML IJ SOLN
INTRAMUSCULAR | Status: DC | PRN
  Administered 2017-06-09: 250 ug via INTRAVENOUS

## 2017-06-09 MED ORDER — ONDANSETRON HCL 4 MG/2ML IJ SOLN: 4.0000 mg | Freq: Four times a day (QID) | INTRAMUSCULAR | Status: DC | PRN

## 2017-06-09 MED ORDER — ALBUMIN HUMAN 5 % IV SOLN
INTRAVENOUS | Status: AC
  Filled 2017-06-09: qty 250

## 2017-06-09 MED ORDER — SIMETHICONE 80 MG PO CHEW: 80.0000 mg | CHEWABLE_TABLET | ORAL | Status: DC | PRN

## 2017-06-09 MED ORDER — HYDROMORPHONE HCL 1 MG/ML IJ SOLN
INTRAMUSCULAR | Status: DC | PRN
  Administered 2017-06-09: 0.5 mg via INTRAVENOUS

## 2017-06-09 MED ORDER — DIPHENHYDRAMINE HCL 50 MG/ML IJ SOLN: 12.5000 mg | INTRAMUSCULAR | Status: DC | PRN

## 2017-06-09 MED ORDER — SODIUM CHLORIDE 0.9 % IV SOLN
2.0000 g | Freq: Once | INTRAVENOUS | Status: AC
  Administered 2017-06-09: 2 g via INTRAVENOUS
  Filled 2017-06-09: qty 2000

## 2017-06-09 MED ORDER — PRENATAL MULTIVITAMIN CH
1.0000 | ORAL_TABLET | Freq: Every day | ORAL | Status: DC
  Administered 2017-06-10 – 2017-06-11 (×2): 1 via ORAL
  Filled 2017-06-09 (×2): qty 1

## 2017-06-09 MED ORDER — MEPERIDINE HCL 25 MG/ML IJ SOLN: 6.2500 mg | INTRAMUSCULAR | Status: DC | PRN

## 2017-06-09 MED ORDER — EPHEDRINE 5 MG/ML INJ: 10.0000 mg | INTRAVENOUS | Status: DC | PRN

## 2017-06-09 MED ORDER — LACTATED RINGERS IV SOLN
INTRAVENOUS | Status: DC
  Administered 2017-06-09 – 2017-06-10 (×2): via INTRAVENOUS

## 2017-06-09 MED ORDER — SCOPOLAMINE 1 MG/3DAYS TD PT72
MEDICATED_PATCH | TRANSDERMAL | Status: AC
  Filled 2017-06-09: qty 1

## 2017-06-09 MED ORDER — SODIUM CHLORIDE 0.9 % IV SOLN
2.0000 g | Freq: Four times a day (QID) | INTRAVENOUS | Status: DC
  Filled 2017-06-09: qty 2000

## 2017-06-09 MED ORDER — DIPHENHYDRAMINE HCL 25 MG PO CAPS
25.0000 mg | ORAL_CAPSULE | Freq: Four times a day (QID) | ORAL | Status: DC | PRN
  Administered 2017-06-09 – 2017-06-10 (×2): 25 mg via ORAL
  Filled 2017-06-09 (×3): qty 1

## 2017-06-09 MED ORDER — MIDAZOLAM HCL 2 MG/2ML IJ SOLN
INTRAMUSCULAR | Status: AC
  Filled 2017-06-09: qty 2

## 2017-06-09 MED ORDER — LACTATED RINGERS IV SOLN
INTRAVENOUS | Status: DC | PRN
  Administered 2017-06-09: 07:00:00 via INTRAVENOUS

## 2017-06-09 MED ORDER — SIMETHICONE 80 MG PO CHEW
80.0000 mg | CHEWABLE_TABLET | Freq: Three times a day (TID) | ORAL | Status: DC
  Administered 2017-06-09 – 2017-06-12 (×7): 80 mg via ORAL
  Filled 2017-06-09 (×8): qty 1

## 2017-06-09 MED ORDER — LACTATED RINGERS IV SOLN
INTRAVENOUS | Status: DC
  Administered 2017-06-09: 06:00:00 via INTRAVENOUS

## 2017-06-09 MED ORDER — PHENYLEPHRINE 40 MCG/ML (10ML) SYRINGE FOR IV PUSH (FOR BLOOD PRESSURE SUPPORT): 80.0000 ug | PREFILLED_SYRINGE | INTRAVENOUS | Status: DC | PRN

## 2017-06-09 MED ORDER — WITCH HAZEL-GLYCERIN EX PADS: 1.0000 "application " | MEDICATED_PAD | CUTANEOUS | Status: DC | PRN

## 2017-06-09 MED ORDER — FENTANYL CITRATE (PF) 250 MCG/5ML IJ SOLN
INTRAMUSCULAR | Status: AC
  Filled 2017-06-09: qty 5

## 2017-06-09 MED ORDER — ALBUMIN HUMAN 5 % IV SOLN
INTRAVENOUS | Status: DC | PRN
  Administered 2017-06-09: 08:00:00 via INTRAVENOUS

## 2017-06-09 MED ORDER — LACTATED RINGERS IV SOLN: 500.0000 mL | INTRAVENOUS | Status: DC | PRN

## 2017-06-09 MED ORDER — IBUPROFEN 600 MG PO TABS
600.0000 mg | ORAL_TABLET | Freq: Four times a day (QID) | ORAL | Status: DC
  Administered 2017-06-09 – 2017-06-12 (×11): 600 mg via ORAL
  Filled 2017-06-09 (×11): qty 1

## 2017-06-09 MED ORDER — ZOLPIDEM TARTRATE 5 MG PO TABS: 5.0000 mg | ORAL_TABLET | Freq: Every evening | ORAL | Status: DC | PRN

## 2017-06-09 MED ORDER — LACTATED RINGERS IV SOLN
INTRAVENOUS | Status: DC | PRN
  Administered 2017-06-09 (×2): via INTRAVENOUS

## 2017-06-09 MED ORDER — DIBUCAINE 1 % RE OINT: 1.0000 "application " | TOPICAL_OINTMENT | RECTAL | Status: DC | PRN

## 2017-06-09 MED ORDER — CEFAZOLIN SODIUM-DEXTROSE 2-4 GM/100ML-% IV SOLN
INTRAVENOUS | Status: AC
  Filled 2017-06-09: qty 100

## 2017-06-09 MED ORDER — ACETAMINOPHEN 10 MG/ML IV SOLN
INTRAVENOUS | Status: AC
  Filled 2017-06-09: qty 100

## 2017-06-09 MED ORDER — CEFAZOLIN SODIUM-DEXTROSE 2-4 GM/100ML-% IV SOLN
2.0000 g | Freq: Three times a day (TID) | INTRAVENOUS | Status: AC
  Administered 2017-06-09 – 2017-06-10 (×3): 2 g via INTRAVENOUS
  Filled 2017-06-09 (×3): qty 100

## 2017-06-09 MED ORDER — SCOPOLAMINE 1 MG/3DAYS TD PT72
MEDICATED_PATCH | TRANSDERMAL | Status: DC | PRN
  Administered 2017-06-09: 1 via TRANSDERMAL

## 2017-06-09 MED ORDER — PHENYLEPHRINE 40 MCG/ML (10ML) SYRINGE FOR IV PUSH (FOR BLOOD PRESSURE SUPPORT)
PREFILLED_SYRINGE | INTRAVENOUS | Status: AC
  Filled 2017-06-09: qty 20

## 2017-06-09 MED ORDER — ACETAMINOPHEN 325 MG PO TABS
650.0000 mg | ORAL_TABLET | ORAL | Status: DC | PRN
  Administered 2017-06-11: 650 mg via ORAL
  Filled 2017-06-09: qty 2

## 2017-06-09 MED ORDER — SUCCINYLCHOLINE CHLORIDE 200 MG/10ML IV SOSY
PREFILLED_SYRINGE | INTRAVENOUS | Status: AC
  Filled 2017-06-09: qty 10

## 2017-06-09 MED ORDER — SUCCINYLCHOLINE CHLORIDE 20 MG/ML IJ SOLN
INTRAMUSCULAR | Status: DC | PRN
  Administered 2017-06-09: 120 mg via INTRAVENOUS

## 2017-06-09 MED ORDER — TETANUS-DIPHTH-ACELL PERTUSSIS 5-2.5-18.5 LF-MCG/0.5 IM SUSP: 0.5000 mL | Freq: Once | INTRAMUSCULAR | Status: DC

## 2017-06-09 MED ORDER — HYDROMORPHONE HCL 1 MG/ML IJ SOLN
1.0000 mg | INTRAMUSCULAR | Status: DC | PRN
  Administered 2017-06-09 (×2): 1 mg via INTRAVENOUS
  Filled 2017-06-09 (×2): qty 1

## 2017-06-09 MED ORDER — MIDAZOLAM HCL 2 MG/2ML IJ SOLN
INTRAMUSCULAR | Status: DC | PRN
Start: 2017-06-09 — End: 2017-06-09
  Administered 2017-06-09: 2 mg via INTRAVENOUS

## 2017-06-09 MED ORDER — LIDOCAINE HCL (PF) 1 % IJ SOLN: 30.0000 mL | INTRAMUSCULAR | Status: DC | PRN

## 2017-06-09 MED ORDER — PHENYLEPHRINE HCL 10 MG/ML IJ SOLN
INTRAMUSCULAR | Status: DC | PRN
  Administered 2017-06-09 (×4): .08 mg via INTRAVENOUS
  Administered 2017-06-09 (×2): .12 mg via INTRAVENOUS

## 2017-06-09 MED ORDER — CEFAZOLIN SODIUM-DEXTROSE 2-3 GM-% IV SOLR
INTRAVENOUS | Status: DC | PRN
  Administered 2017-06-09: 2 g via INTRAVENOUS

## 2017-06-09 MED ORDER — ONDANSETRON HCL 4 MG/2ML IJ SOLN
INTRAMUSCULAR | Status: AC
  Filled 2017-06-09: qty 2

## 2017-06-09 MED ORDER — LACTATED RINGERS IV SOLN
INTRAVENOUS | Status: DC | PRN
  Administered 2017-06-09: 40 [IU] via INTRAVENOUS

## 2017-06-09 MED ORDER — ONDANSETRON HCL 4 MG/2ML IJ SOLN
INTRAMUSCULAR | Status: DC | PRN
  Administered 2017-06-09: 4 mg via INTRAVENOUS

## 2017-06-09 MED ORDER — LIDOCAINE HCL 1 % IJ SOLN
INTRAMUSCULAR | Status: DC | PRN
  Administered 2017-06-09: 6 mL

## 2017-06-09 MED ORDER — FENTANYL 2.5 MCG/ML BUPIVACAINE 1/10 % EPIDURAL INFUSION (WH - ANES)
14.0000 mL/h | INTRAMUSCULAR | Status: DC | PRN
Start: 2017-06-09 — End: 2017-06-09

## 2017-06-09 MED ORDER — OXYCODONE-ACETAMINOPHEN 5-325 MG PO TABS: 1.0000 | ORAL_TABLET | ORAL | Status: DC | PRN

## 2017-06-09 SURGICAL SUPPLY — 45 items
ADH SKN CLS APL DERMABOND .7 (GAUZE/BANDAGES/DRESSINGS)
APL SKNCLS STERI-STRIP NONHPOA (GAUZE/BANDAGES/DRESSINGS) ×1
BARRIER ADHS 3X4 INTERCEED (GAUZE/BANDAGES/DRESSINGS) ×2 IMPLANT
BENZOIN TINCTURE PRP APPL 2/3 (GAUZE/BANDAGES/DRESSINGS) ×3 IMPLANT
BRR ADH 4X3 ABS CNTRL BYND (GAUZE/BANDAGES/DRESSINGS) ×1
CHLORAPREP W/TINT 26ML (MISCELLANEOUS) ×3 IMPLANT
CLAMP CORD UMBIL (MISCELLANEOUS) IMPLANT
CLOSURE WOUND 1/2 X4 (GAUZE/BANDAGES/DRESSINGS) ×1
CLOTH BEACON ORANGE TIMEOUT ST (SAFETY) ×3 IMPLANT
DERMABOND ADVANCED (GAUZE/BANDAGES/DRESSINGS)
DERMABOND ADVANCED .7 DNX12 (GAUZE/BANDAGES/DRESSINGS) IMPLANT
DRSG OPSITE POSTOP 4X10 (GAUZE/BANDAGES/DRESSINGS) ×3 IMPLANT
ELECT REM PT RETURN 9FT ADLT (ELECTROSURGICAL) ×3
ELECTRODE REM PT RTRN 9FT ADLT (ELECTROSURGICAL) ×1 IMPLANT
EXTRACTOR VACUUM KIWI (MISCELLANEOUS) IMPLANT
GLOVE BIOGEL PI IND STRL 6.5 (GLOVE) ×1 IMPLANT
GLOVE BIOGEL PI IND STRL 7.0 (GLOVE) ×2 IMPLANT
GLOVE BIOGEL PI INDICATOR 6.5 (GLOVE) ×2
GLOVE BIOGEL PI INDICATOR 7.0 (GLOVE) ×4
GLOVE ECLIPSE 6.5 STRL STRAW (GLOVE) ×3 IMPLANT
GOWN STRL REUS W/TWL LRG LVL3 (GOWN DISPOSABLE) ×9 IMPLANT
KIT ABG SYR 3ML LUER SLIP (SYRINGE) IMPLANT
NDL HYPO 25X5/8 SAFETYGLIDE (NEEDLE) IMPLANT
NEEDLE HYPO 22GX1.5 SAFETY (NEEDLE) ×2 IMPLANT
NEEDLE HYPO 25X5/8 SAFETYGLIDE (NEEDLE) ×3 IMPLANT
NS IRRIG 1000ML POUR BTL (IV SOLUTION) ×3 IMPLANT
PACK C SECTION WH (CUSTOM PROCEDURE TRAY) ×3 IMPLANT
PAD ABD 7.5X8 STRL (GAUZE/BANDAGES/DRESSINGS) ×3 IMPLANT
PAD OB MATERNITY 4.3X12.25 (PERSONAL CARE ITEMS) ×3 IMPLANT
PENCIL SMOKE EVAC W/HOLSTER (ELECTROSURGICAL) ×3 IMPLANT
RTRCTR C-SECT PINK 25CM LRG (MISCELLANEOUS) ×3 IMPLANT
SPONGE LAP 18X18 X RAY DECT (DISPOSABLE) ×4 IMPLANT
STRIP CLOSURE SKIN 1/2X4 (GAUZE/BANDAGES/DRESSINGS) ×2 IMPLANT
SUT PLAIN 0 NONE (SUTURE) IMPLANT
SUT PLAIN 2 0 XLH (SUTURE) IMPLANT
SUT VIC AB 0 CT1 27 (SUTURE) ×6
SUT VIC AB 0 CT1 27XBRD ANBCTR (SUTURE) ×2 IMPLANT
SUT VIC AB 0 CTX 36 (SUTURE) ×9
SUT VIC AB 0 CTX36XBRD ANBCTRL (SUTURE) ×3 IMPLANT
SUT VIC AB 2-0 CT1 27 (SUTURE) ×6
SUT VIC AB 2-0 CT1 TAPERPNT 27 (SUTURE) ×1 IMPLANT
SUT VIC AB 4-0 KS 27 (SUTURE) ×3 IMPLANT
SYR CONTROL 10ML LL (SYRINGE) ×2 IMPLANT
TOWEL OR 17X24 6PK STRL BLUE (TOWEL DISPOSABLE) ×3 IMPLANT
TRAY FOLEY BAG SILVER LF 14FR (SET/KITS/TRAYS/PACK) IMPLANT

## 2017-06-09 NOTE — H&P (Signed)
Karen Berg is a 31 y.o. female, G1P0 at 39.5  weeks, presenting for labor.  SROM clear fluid..  Patient Active Problem List   Diagnosis Date Noted  . Normal labor 06/09/2017  . Iron deficiency anemia due to chronic blood loss 04/26/2017  . Uterine fibroids affecting pregnancy 11/30/2016  . Eczema 01/20/2015  . Intractable migraine with aura without status migrainosus 01/20/2015    History of present pregnancy: Patient entered care at  weeks.   EDC of 06/11/2017 was established by LMP.   Anatomy scan:  19.3 weeks, with normal findings and an posterior placenta.   Additional [place] evaluations:  Multiple large fibroids stable during pregnancy.  Fetal growth not affected.   Significant prenatal events:  Anemia, large uterine fibroids Last evaluation: 06/08/2017  OB History    Gravida Para Term Preterm AB Living   1 1 1     1    SAB TAB Ectopic Multiple Live Births         0 1     Past Medical History:  Diagnosis Date  . Eczema   . Family history of anesthesia complication    Mother had PONV  . Fibroids   . GERD (gastroesophageal reflux disease)   . Migraine    Past Surgical History:  Procedure Laterality Date  . CHOLECYSTECTOMY N/A 02/05/2014   Procedure: LAPAROSCOPIC CHOLECYSTECTOMY WITH INTRAOPERATIVE CHOLANGIOGRAM;  Surgeon: [name]. Georgette Dover, MD;  Location: [place];  Service: General;  Laterality: N/A;  . WISDOM TOOTH EXTRACTION     Family History: family history includes Cancer - Lung in her other; Cancer - Prostate in her other; Diabetes in her other. Social History:  reports that she has never smoked. She has never used smokeless tobacco. She reports that she does not drink alcohol or use drugs.   Prenatal Transfer Tool  Maternal Diabetes: No Genetic Screening: Normal Maternal Ultrasounds/Referrals: Yes, Normal fetal anatomy, monitor growth due to fibroids. Fetal Ultrasounds or other Referrals:  None Maternal Substance Abuse:  No Significant Maternal Medications:   None Significant Maternal Lab Results: None  TDAP Yes Flu Yes  ROS: All 10 systems reviewed and negative expect as stated above.  Allergies  Allergen Reactions  . No Known Allergies      Dilation: 10 Station: -1, -2 Exam by:: Ozan Blood pressure (!) 171/96, pulse 72, temperature (!) 97.3 F (36.3 C), temperature source Oral, resp. rate 18, height 5\' 4"  (1.626 m), weight 77.1 kg (170 lb), last menstrual period 09/04/2016, unknown if currently breastfeeding.  Chest clear Heart RRR without murmur Abd gravid, NT, FH wnl Pelvic: see above Ext: Neg edema, +2/+2 reflexes  FHR: Category 2 UCs:  4 in 10 minutes  Prenatal labs: ABO, Rh: --/--/O POS (08/23 2671) Antibody: NEG (08/23 2458) Rubella:  Immune RPR: Non Reactive (01/11 2145)  HBsAg: Negative (12/27 0000)  HIV: Non-reactive (12/27 0000)  GBS: Positive (07/31 0000) Sickle cell/Hgb electrophoresis:  AA Pap:  2017 negative GC:  Neg Chlamydia: Neg Genetic screenings: NIPT negative, AFP negative Glucola:  112 Other:   Hgb 10.9  at NOB, 9.9 at 28 weeks       Assessment/Plan: IUP at 39.5 IUP active labor  Plan: Admit to Person Routine [facility] orders Pain med/epidural prn Ampicillin for GBS prophylaxis   Pleas Koch ProtheroCNM, MSN 06/09/2017, 8:22 AM

## 2017-06-09 NOTE — Anesthesia Preprocedure Evaluation (Signed)
Anesthesia Evaluation  Patient identified by MRN, date of birth, ID band Patient awake    Reviewed: Allergy & Precautions, H&P , NPO status , Patient's Chart, lab work & pertinent test results, reviewed documented beta blocker date and time   History of Anesthesia Complications (+) Family history of anesthesia reaction  Airway Mallampati: II  TM Distance: >3 FB Neck ROM: Full    Dental no notable dental hx.    Pulmonary neg pulmonary ROS,    Pulmonary exam normal breath sounds clear to auscultation       Cardiovascular negative cardio ROS Normal cardiovascular exam Rhythm:Regular Rate:Normal     Neuro/Psych  Headaches, negative psych ROS   GI/Hepatic Neg liver ROS, GERD  Medicated and Controlled,  Endo/Other  negative endocrine ROS  Renal/GU negative Renal ROS     Musculoskeletal negative musculoskeletal ROS (+)   Abdominal   Peds  Hematology negative hematology ROS (+)   Anesthesia Other Findings   Reproductive/Obstetrics negative OB ROS (+) Pregnancy                             Anesthesia Physical  Anesthesia Plan  ASA: II and emergent  Anesthesia Plan: General   Post-op Pain Management:    Induction: Intravenous  PONV Risk Score and Plan: 2 and Ondansetron and Treatment may vary due to age or medical condition  Airway Management Planned: Oral ETT  Additional Equipment:   Intra-op Plan:   Post-operative Plan: Extubation in OR  Informed Consent: I have reviewed the patients History and Physical, chart, labs and discussed the procedure including the risks, benefits and alternatives for the proposed anesthesia with the patient or authorized representative who has indicated his/her understanding and acceptance.     Plan Discussed with:   Anesthesia Plan Comments:         Anesthesia Quick Evaluation

## 2017-06-09 NOTE — Progress Notes (Signed)
Late entry for 0650 this am.  Called to bedside due to non-reassuring heart tones.  With pushing FHT were in the 70s.  SVE: C/C/+3.  Pt was verbally consented for a vacuum delivery.  Kiwi was placed, pop off occurred with no descent of fetal head.  Due to continued non-reassuring heart tones, she was consented for a stat C-section  Risk benefits and alternatives of cesarean section were discussed with the patient including but not limited to infection, bleeding, damage to bowel , bladder and baby with the need for further surgery. Pt voiced understanding and desires to proceed.   Janyth Pupa, DO [phone] (pager) [phone] (office)

## 2017-06-09 NOTE — Transfer of Care (Signed)
Immediate Anesthesia Transfer of Care Note  Patient: Karen Berg  Procedure(s) Performed: Procedure(s): CESAREAN SECTION (N/A)  Patient Location: PACU  Anesthesia Type:General  Level of Consciousness: awake, alert , drowsy and patient cooperative  Airway & Oxygen Therapy: Patient Spontanous Breathing and Patient connected to nasal cannula oxygen  Post-op Assessment: Report given to RN, Post -op Vital signs reviewed and stable and Patient moving all extremities X 4  Post vital signs: stable  Last Vitals:  Vitals:   06/09/17 0614 06/09/17 0617  BP: (!) 173/93 (!) 171/96  Pulse: 69 72  Resp:  18  Temp:  (!) 36.3 C    Last Pain:  Vitals:   06/09/17 0617  TempSrc: Oral  PainSc:       Patients Stated Pain Goal: 7 (12/16/29 4388)  Complications: No apparent anesthesia complications

## 2017-06-09 NOTE — Anesthesia Procedure Notes (Signed)
Procedure Name: Intubation Date/Time: 06/09/2017 7:09 AM Performed by: Raenette Rover Pre-anesthesia Checklist: Patient identified, Emergency Drugs available, Suction available and Patient being monitored Patient Re-evaluated:Patient Re-evaluated prior to induction Oxygen Delivery Method: Circle system utilized Preoxygenation: Pre-oxygenation with 100% oxygen Induction Type: IV induction, Cricoid Pressure applied and Rapid sequence Laryngoscope Size: Miller and 2 Grade View: Grade I Tube type: Oral Tube size: 7.0 mm Number of attempts: 1 Airway Equipment and Method: Stylet Placement Confirmation: ETT inserted through vocal cords under direct vision,  positive ETCO2,  CO2 detector and breath sounds checked- equal and bilateral Secured at: 21 cm Tube secured with: Tape Dental Injury: Teeth and Oropharynx as per pre-operative assessment

## 2017-06-09 NOTE — Lactation Note (Signed)
This note was copied from a baby's chart. Lactation Consultation Note  Patient Name: Boy Pranathi Winfree [id number] Date: 06/09/2017 Reason for consult: Initial assessment  Baby 18 hours old. Assisted mom with hand expression and mom has easily expressible milk already squirting from breast. Enc mom to hand express with latch. Mom able to latch baby herself in laid back position with no assistance from this LC. Reviewed feeding cues and cluster feeding, and enc lots of STS. Discussed how to hold baby in cross-cradle position when mom sitting more upright--as needed. Mom given [facility] brochure, aware of OP/BFSG and [place] phone line assistance after D/C.  Maternal Data Has patient been taught Hand Expression?: Yes Does the patient have breastfeeding experience prior to this delivery?: No  Feeding Feeding Type: Breast Fed Length of feed: 20 min  LATCH Score Latch: Grasps breast easily, tongue down, lips flanged, rhythmical sucking.  Audible Swallowing: A few with stimulation  Type of Nipple: Everted at rest and after stimulation  Comfort (Breast/Nipple): Soft / non-tender  Hold (Positioning): No assistance needed to correctly position infant at breast.  LATCH Score: 9  Interventions Interventions: Breast feeding basics reviewed;Assisted with latch;Hand express;Adjust position;Position options (Watched mom latch baby in laid back position.)  Lactation Tools Discussed/Used     Consult Status Consult Status: Follow-up Date: 06/10/17 Follow-up type: In-patient    Andres Labrum 06/09/2017, 6:26 PM

## 2017-06-09 NOTE — MAU Note (Signed)
Pt presents to MAU with contractions that started last night at 6 pm, PROM at 5 am. Has had bloody show. +FM

## 2017-06-09 NOTE — Op Note (Signed)
PreOp Diagnosis: 1) non-reassuring fetal heart tones 2) Intrauterine pregnancy @ [redacted]w[redacted]d 3) Uterine fibroids PostOp Diagnosis: same Procedure: Primary LTCS Surgeon: Dr. Janyth Pupa Assistant: Irene Shipper, CNM Anesthesia: general Complications: none EBL: 1500cc UOP: 150cc Fluids: 2000cc IVF, 300cc albumin  Findings: Female infant from OP presentation.  Lobulated uterus with multiple fibroids- mostly anterior and fundal.  PROCEDURE:  Informed consent was obtained from the patient with risks, benefits, complications, treatment options, and expected outcomes discussed with the patient.  The patient concurred with the proposed plan, giving informed consent with form signed.   The patient was taken to Operating Room. With induction of anesthesia, the patient was prepped and draped.. A Pfannenstiel incision was made and carried down through the subcutaneous tissue to the fascia. The fascia was incised in the midline and extended transversely. The superior aspect of the fascial incision was grasped with Kochers elevated and the underlying muscle dissected off. The inferior aspect of the facial incision was in similar fashion, grasped elevated and rectus muscles dissected off. The peritoneum was identified and entered. Peritoneal incision was extended longitudinally. Alexis retractor was placed.  The utero-vesical peritoneal reflection was identified and incised transversely with the [facility] scissors, the incision extended laterally, the bladder flap created digitally. A low transverse uterine incision was made and the infants head delivered atraumatically. After the umbilical cord was clamped and cut cord blood was obtained for evaluation.   The placenta was removed intact and appeared normal. The uterine outline, tubes and ovaries appeared normal. The uterine incision was closed with running locked sutures of 0 Vicryl and a second layer of the same stitch was used in an imbricating fashion.  Excellent  hemostasis was obtained.  The pericolic gutters were then cleared of all clots and debris. Interceed was placed over the incision.  The fascia was then reapproximated with running sutures of 0 Vicryl. The subcutaneous tissue was reapproximated with 2-0 plain gut suture.  The skin was closed with 4-0 vicryl in a subcuticular fashion.  Instrument, sponge, and needle counts were correct prior the abdominal closure and at the conclusion of the case. The patient was taken to recovery in stable condition.  Janyth Pupa, DO [phone] (pager) [phone] (office)

## 2017-06-09 NOTE — Anesthesia Postprocedure Evaluation (Signed)
Anesthesia Post Note  Patient: Interior and spatial designer  Procedure(s) Performed: Procedure(s) (LRB): CESAREAN SECTION (N/A)     Patient location during evaluation: PACU Anesthesia Type: General Level of consciousness: awake and alert Pain management: pain level controlled Vital Signs Assessment: post-procedure vital signs reviewed and stable Respiratory status: spontaneous breathing, nonlabored ventilation and respiratory function stable Cardiovascular status: blood pressure returned to baseline and stable Postop Assessment: no signs of nausea or vomiting Anesthetic complications: no    Last Vitals:  Vitals:   06/09/17 0915 06/09/17 0930  BP:  127/87  Pulse:  81  Resp: 16 16  Temp:    SpO2:  100%    Last Pain:  Vitals:   06/09/17 0930  TempSrc:   PainSc: 6    Pain Goal: Patients Stated Pain Goal: 7 (06/09/17 0614)               Lynda Rainwater

## 2017-06-10 ENCOUNTER — Encounter (HOSPITAL_COMMUNITY): Payer: Self-pay | Admitting: Obstetrics & Gynecology

## 2017-06-10 LAB — CBC
HCT: 23.4 % — ABNORMAL LOW (ref 36.0–46.0)
Hemoglobin: 8 g/dL — ABNORMAL LOW (ref 12.0–15.0)
MCH: 29.1 pg (ref 26.0–34.0)
MCHC: 34.2 g/dL (ref 30.0–36.0)
MCV: 85.1 fL (ref 78.0–100.0)
PLATELETS: 164 10*3/uL (ref 150–400)
RBC: 2.75 MIL/uL — AB (ref 3.87–5.11)
RDW: 22.4 % — ABNORMAL HIGH (ref 11.5–15.5)
WBC: 11.2 10*3/uL — AB (ref 4.0–10.5)

## 2017-06-10 LAB — RPR: RPR: NONREACTIVE

## 2017-06-10 MED ORDER — FERROUS SULFATE 325 (65 FE) MG PO TABS
325.0000 mg | ORAL_TABLET | Freq: Three times a day (TID) | ORAL | Status: DC
  Administered 2017-06-10 – 2017-06-12 (×6): 325 mg via ORAL
  Filled 2017-06-10 (×8): qty 1

## 2017-06-10 MED ORDER — DOCUSATE SODIUM 100 MG PO CAPS
100.0000 mg | ORAL_CAPSULE | Freq: Two times a day (BID) | ORAL | Status: DC
  Administered 2017-06-10 – 2017-06-12 (×5): 100 mg via ORAL
  Filled 2017-06-10 (×5): qty 1

## 2017-06-10 MED ORDER — POTASSIUM CHLORIDE CRYS ER 20 MEQ PO TBCR
20.0000 meq | EXTENDED_RELEASE_TABLET | Freq: Two times a day (BID) | ORAL | Status: DC
  Administered 2017-06-10 – 2017-06-12 (×6): 20 meq via ORAL
  Filled 2017-06-10 (×8): qty 1

## 2017-06-10 NOTE — Lactation Note (Signed)
This note was copied from a baby's chart. Lactation Consultation Note  Patient Name: Karen Berg JASNK'N Date: 06/10/2017 Reason for consult: Follow-up assessment Baby is 58 hours old , 0% weight loss  LC reviewed and updated doc flow sheets per mom and dad  Baby latched in laid back position on the right breast / swallows noted/ and mom comfortable.  LC reviewed basics of of breast feeding and encouraged STS feedings every feeding until the baby can stay  Awake for a feeding. Discussed nutritive vs non - nutritive feeding patterns and to watch for hanging out latched.  Consider alternating between at least 2 different breast feeding positions. Breast compressions with latch to enhance  Volume to baby.  Per mom will have a DEBP Medela at home.  Mother informed of post-discharge support and given phone number to the lactation department, including services for phone call assistance; out-patient appointments; and breastfeeding support group. List of other breastfeeding resources in the community given in the handout. Encouraged mother to call for problems or concerns related to breastfeeding.  Maternal Data    Feeding Feeding Type:  (baby latched with depth , laid back ) Length of feed:  (swallows noted, increased w/ breast compressions )  LATCH Score Latch:  (latched with depth )  Audible Swallowing:  (swallows noted )     Comfort (Breast/Nipple):  (per mom comfortable )  Hold (Positioning):  (mom independent with latch )     Interventions Interventions: Breast feeding basics reviewed;Breast compression (enc STS next feeding )  Lactation Tools Discussed/Used WIC Program: No   Consult Status Consult Status: Follow-up Date: 06/11/17 Follow-up type: In-patient    [place] 06/10/2017, 2:30 PM

## 2017-06-10 NOTE — Anesthesia Postprocedure Evaluation (Signed)
Anesthesia Post Note  Patient: Interior and spatial designer  Procedure(s) Performed: Procedure(s) (LRB): CESAREAN SECTION (N/A)     Patient location during evaluation: Mother Baby Anesthesia Type: General Level of consciousness: awake, awake and alert, oriented and patient cooperative Pain management: pain level controlled Vital Signs Assessment: post-procedure vital signs reviewed and stable Respiratory status: spontaneous breathing, nonlabored ventilation and respiratory function stable Cardiovascular status: stable Postop Assessment: no signs of nausea or vomiting Anesthetic complications: no    Last Vitals:  Vitals:   06/10/17 0135 06/10/17 0540  BP: 124/72 (!) 144/79  Pulse: 89 82  Resp: 18 18  Temp: 36.9 C 36.8 C  SpO2:      Last Pain:  Vitals:   06/10/17 0735  TempSrc:   PainSc: 9    Pain Goal: Patients Stated Pain Goal: 7 (06/09/17 0614)               Keyanah Kozicki L

## 2017-06-10 NOTE — Progress Notes (Signed)
Contacted CNM to verify order for orthostatic vital signs, placed because of inconsistent blood pressures and low HgB

## 2017-06-10 NOTE — Addendum Note (Signed)
Addendum  created 06/10/17 1700 by Raenette Rover, CRNA   Sign clinical note

## 2017-06-10 NOTE — Progress Notes (Signed)
S: Review of labs, vitals and uop   Pt w/o muscle weakness  O:  Results for orders placed or performed during the hospital encounter of 06/09/17 (from the past 24 hour(s))  Type and screen [place]     Status: None   Collection Time: 06/09/17  6:13 AM  Result Value Ref Range   ABO/RH(D) O POS    Antibody Screen NEG    Sample Expiration 06/12/2017   CBC     Status: Abnormal   Collection Time: 06/09/17  6:13 AM  Result Value Ref Range   WBC 8.0 4.0 - 10.5 K/uL   RBC 4.49 3.87 - 5.11 MIL/uL   Hemoglobin 12.9 12.0 - 15.0 g/dL   HCT 37.7 36.0 - 46.0 %   MCV 84.0 78.0 - 100.0 fL   MCH 28.7 26.0 - 34.0 pg   MCHC 34.2 30.0 - 36.0 g/dL   RDW 21.8 (H) 11.5 - 15.5 %   Platelets 187 150 - 400 K/uL  Comprehensive metabolic panel     Status: Abnormal   Collection Time: 06/09/17  6:13 AM  Result Value Ref Range   Sodium 136 135 - 145 mmol/L   Potassium 2.9 (L) 3.5 - 5.1 mmol/L   Chloride 102 101 - 111 mmol/L   CO2 19 (L) 22 - 32 mmol/L   Glucose, Bld 96 65 - 99 mg/dL   BUN 5 (L) 6 - 20 mg/dL   Creatinine, Ser 0.71 0.44 - 1.00 mg/dL   Calcium 9.4 8.9 - 10.3 mg/dL   Total Protein 7.4 6.5 - 8.1 g/dL   Albumin 3.6 3.5 - 5.0 g/dL   AST 39 15 - 41 U/L   ALT 27 14 - 54 U/L   Alkaline Phosphatase 122 38 - 126 U/L   Total Bilirubin 0.6 0.3 - 1.2 mg/dL   GFR calc non Af Amer >60 >60 mL/min   GFR calc Af Amer >60 >60 mL/min   Anion gap 15 5 - 15  Lactate dehydrogenase     Status: None   Collection Time: 06/09/17  6:13 AM  Result Value Ref Range   LDH 181 98 - 192 U/L   BPs since delivery 112-143/71-96. Suspect elevation in BPs 2/2 pain. Pt had general anesthesia. Later in the day she was started on IV Dilaudid for breakthrough pain and Percocet po when she started po meds/food.  A: Hypokalemia  P: Potassium replacement tabs   Farrel Gordon, CNM 06/10/17, 2:22 AM

## 2017-06-10 NOTE — Progress Notes (Signed)
Subjective: Postpartum Day 1: Cesarean Delivery PLTCS due to non reassuring FHT's; general anesthesia; Uterine Fibroids Patient reports that pain is well-managed.Lochia normal.  Ambulating, voiding, tolerating diet as ordered without difficulty. Normal flatus.  Absent bowel movement.  Objective: Vital signs in last 24 hours: Temp:  [97.8 F (36.6 C)-98.4 F (36.9 C)] 98.3 F (36.8 C) (08/24 0540) Pulse Rate:  [65-89] 82 (08/24 0540) Resp:  [17-18] 18 (08/24 0540) BP: (122-144)/(71-83) 144/79 (08/24 0540) SpO2:  [98 %-100 %] 100 % (08/23 1730)  Physical Exam:  General: alert Lochia: appropriate Uterine Fundus: firm and appropriately tender Incision: dressing dry and clean DVT Evaluation: No evidence of DVT seen on physical exam. Edema none   Recent Labs  06/09/17 0613 06/10/17 0508  HGB 12.9 8.0*  HCT 37.7 23.4*    Assessment/Plan: Status post Cesarean section. Doing well postoperatively.  Breastfeeding Unsure about birth control Inpatient circ planned for baby Continue current care. Anticipate discharge Sunday (3 days)  Cecille Rubin A Mattye Verdone CNM 06/10/2017, 10:31 AM

## 2017-06-11 NOTE — Progress Notes (Signed)
Subjective: Postpartum Day 2: Cesarean Delivery Patient reports that pain is well-managed.Lochia normal.  Ambulating, voiding, tolerating diet as ordered without difficulty. Normal flatus.  Absent bowel movement.  Objective: Vital signs in last 24 hours: Temp:  [98.9 F (37.2 C)-99.1 F (37.3 C)] 98.9 F (37.2 C) (08/25 0615) Pulse Rate:  [85-89] 85 (08/25 0615) Resp:  [17-18] 18 (08/25 0615) BP: (126-127)/(72-82) 127/72 (08/25 0615) SpO2:  [98 %] 98 % (08/25 0615)  Physical Exam:  General: alert Lochia: appropriate Uterine Fundus: firm and appropriately tender Incision: dressing dry and clean DVT Evaluation: No evidence of DVT seen on physical exam.    Recent Labs  06/09/17 0613 06/10/17 0508  HGB 12.9 8.0*  HCT 37.7 23.4*    Assessment/Plan: Status post Cesarean section. Doing well postoperatively.  Continue current care. Anticipate discharge tomorrow  Bertram Gala Clemmons CNM 06/11/2017, 12:54 PM

## 2017-06-12 MED ORDER — IBUPROFEN 600 MG PO TABS: 600.0000 mg | ORAL_TABLET | Freq: Four times a day (QID) | ORAL | 0 refills | Status: DC

## 2017-06-12 MED ORDER — FERROUS SULFATE 325 (65 FE) MG PO TABS: 325.0000 mg | ORAL_TABLET | Freq: Three times a day (TID) | ORAL | 3 refills | Status: DC

## 2017-06-12 MED ORDER — OXYCODONE-ACETAMINOPHEN 5-325 MG PO TABS: 1.0000 | ORAL_TABLET | ORAL | 0 refills | Status: DC | PRN

## 2017-06-12 NOTE — Discharge Summary (Signed)
Obstetric Discharge Summary Reason for Admission: onset of labor Prenatal Procedures: ultrasound and multiple fibroids Intrapartum Procedures: cesarean: low cervical, transverse by Nelda Marseille Emergent No OP note Available at this time Postpartum Procedures: none Complications-Operative and Postpartum: none Hemoglobin  Date Value Ref Range Status  06/10/2017 8.0 (L) 12.0 - 15.0 g/dL Final    Comment:    REPEATED TO VERIFY DELTA CHECK NOTED    HGB  Date Value Ref Range Status  04/26/2017 8.2 (L) 11.6 - 15.9 g/dL Final   HCT  Date Value Ref Range Status  06/10/2017 23.4 (L) 36.0 - 46.0 % Final  04/26/2017 26.0 (L) 34.8 - 46.6 % Final    Physical Exam:  General: alert, cooperative and appears stated age 5: appropriate Uterine Fundus: firm Incision: healing well DVT Evaluation: No evidence of DVT seen on physical exam.  Discharge Diagnoses: Term Pregnancy-delivered  Discharge Information: Date: 06/12/2017 Activity: pelvic rest Diet: routine Medications: PNV, Ibuprofen, Iron and Percocet Condition: stable Instructions: refer to practice specific booklet Discharge to: home [facility]. Schedule an appointment as soon as possible for a visit in 1 week(s).   Specialty:  Obstetrics and Gynecology Why:  Incision check Contact information: [address]. [address] [place] [address] [phone]          Newborn Data: Live born female - Inpatient circ performed Birth Weight: 8 lb 1.5 oz (3670 g) APGAR: 6, 8  Home with mother.  Lori A Clemmons CNM 06/12/2017, 12:16 PM

## 2017-06-12 NOTE — Lactation Note (Signed)
This note was copied from a baby's chart. Lactation Consultation Note  Patient Name: Karen Berg JOITG'P Date: 06/12/2017 Reason for consult: Follow-up assessment;Infant weight loss (6% weight loss . LC updated doc flow sheets)  Baby is 24 hours old and has exclusively breast fed since birth.  Mom reports she is hearing more swallows and that the baby has been cluster feeding.  Also per mom nipples are sensitive,  LC assessed tissue with mom's permission and noted no breakdown and clear, compressible areolas,  Breast are filling and warm, LC mentioned to mom her milk is coming in , positive sign,  Sore nipple and engorgement prevention and tx reviewed.  LC instructed mom on the use hand pump and shells between feedings except when sleeping.  Discussed nutritive vs non- nutritive feeding patterns and to watch for the baby when latched for hanging out.  STS feedings until the abby is back to birth weight and can stay awake for feeding.  LC reviewed doc flow sheets and updated per mom.  Mother informed of post-discharge support and given phone number to the lactation department, including services for phone call assistance; out-patient appointments; and breastfeeding support group. List of other breastfeeding resources in the community given in the handout. Encouraged mother to call for problems or concerns related to breastfeeding.    Maternal Data Has patient been taught Hand Expression?: Yes (LC reviewed at this consult with assessment )  Feeding Feeding Type:  (per mom baby recently breast fed . presently sleeping ) Length of feed: 10 min (per mom )  LATCH Score          Comfort (Breast/Nipple): Filling, red/small blisters or bruises, mild/mod discomfort (per mom C/O soreness , see LC note )        Interventions Interventions: Breast feeding basics reviewed;Shells;Hand pump;Hand express;Pre-pump if needed  Lactation Tools Discussed/Used Tools: Shells;Pump Shell  Type: Inverted Breast pump type: Manual Pump Review: Setup, frequency, and cleaning Initiated by:: MAI  Date initiated:: 06/12/17   Consult Status Consult Status: Complete Date: 06/12/17    Jerlyn Ly Aurilla Coulibaly 06/12/2017, 1:01 PM

## 2017-06-12 NOTE — Discharge Instructions (Signed)
Postpartum Depression and Baby Blues The postpartum period begins right after the birth of a baby. During this time, there is often a great amount of joy and excitement. It is also a time of many changes in the life of the parents. Regardless of how many times a mother gives birth, each child brings new challenges and dynamics to the family. It is not unusual to have feelings of excitement along with confusing shifts in moods, emotions, and thoughts. All mothers are at risk of developing postpartum depression or the "baby blues." These mood changes can occur right after giving birth, or they may occur many months after giving birth. The baby blues or postpartum depression can be mild or severe. Additionally, postpartum depression can go away rather quickly, or it can be a long-term condition. What are the causes? Raised hormone levels and the rapid drop in those levels are thought to be a main cause of postpartum depression and the baby blues. A number of hormones change during and after pregnancy. Estrogen and progesterone usually decrease right after the delivery of your baby. The levels of thyroid hormone and various cortisol steroids also rapidly drop. Other factors that play a role in these mood changes include major life events and genetics. What increases the risk? If you have any of the following risks for the baby blues or postpartum depression, know what symptoms to watch out for during the postpartum period. Risk factors that may increase the likelihood of getting the baby blues or postpartum depression include:  Having a personal or family history of depression.  Having depression while being pregnant.  Having premenstrual mood issues or mood issues related to oral contraceptives.  Having a lot of life stress.  Having marital conflict.  Lacking a social support network.  Having a baby with special needs.  Having health problems, such as diabetes.  What are the signs or  symptoms? Symptoms of baby blues include:  Brief changes in mood, such as going from extreme happiness to sadness.  Decreased concentration.  Difficulty sleeping.  Crying spells, tearfulness.  Irritability.  Anxiety.  Symptoms of postpartum depression typically begin within the first month after giving birth. These symptoms include:  Difficulty sleeping or excessive sleepiness.  Marked weight loss.  Agitation.  Feelings of worthlessness.  Lack of interest in activity or food.  Postpartum psychosis is a very serious condition and can be dangerous. Fortunately, it is rare. Displaying any of the following symptoms is cause for immediate medical attention. Symptoms of postpartum psychosis include:  Hallucinations and delusions.  Bizarre or disorganized behavior.  Confusion or disorientation.  How is this diagnosed? A diagnosis is made by an evaluation of your symptoms. There are no medical or lab tests that lead to a diagnosis, but there are various questionnaires that a health care provider may use to identify those with the baby blues, postpartum depression, or psychosis. Often, a screening tool called the [place] Postnatal Depression Scale is used to diagnose depression in the postpartum period. How is this treated? The baby blues usually goes away on its own in 1-2 weeks. Social support is often all that is needed. You will be encouraged to get adequate sleep and rest. Occasionally, you may be given medicines to help you sleep. Postpartum depression requires treatment because it can last several months or longer if it is not treated. Treatment may include individual or group therapy, medicine, or both to address any social, physiological, and psychological factors that may play a role in the  depression. Regular exercise, a healthy diet, rest, and social support may also be strongly recommended. Postpartum psychosis is more serious and needs treatment right away.  Hospitalization is often needed. Follow these instructions at home:  Get as much rest as you can. Nap when the baby sleeps.  Exercise regularly. Some women find yoga and walking to be beneficial.  Eat a balanced and nourishing diet.  Do little things that you enjoy. Have a cup of tea, take a bubble bath, read your favorite magazine, or listen to your favorite music.  Avoid alcohol.  Ask for help with household chores, cooking, grocery shopping, or running errands as needed. Do not try to do everything.  Talk to people close to you about how you are feeling. Get support from your partner, family members, friends, or other new moms.  Try to stay positive in how you think. Think about the things you are grateful for.  Do not spend a lot of time alone.  Only take over-the-counter or prescription medicine as directed by your health care provider.  Keep all your postpartum appointments.  Let your health care provider know if you have any concerns. Contact a health care provider if: You are having a reaction to or problems with your medicine. Get help right away if:  You have suicidal feelings.  You think you may harm the baby or someone else. This information is not intended to replace advice given to you by your health care provider. Make sure you discuss any questions you have with your health care provider. Document Released: 07/08/2004 Document Revised: 03/11/2016 Document Reviewed: 07/16/2013 Elsevier Interactive Patient Education  2017 [place]. Postpartum Care After Cesarean Delivery The period of time right after you deliver your newborn is called the postpartum period. What kind of medical care will I receive?  You may continue to receive fluids and medicines through an IV tube inserted into one of your veins.  You may have small, flexible tube (catheter) draining urine from your bladder into a bag outside of your body. The catheter will be removed as soon as  possible.  You may be given a squirt bottle to use when you go to the bathroom. You may use this until you are comfortable wiping as usual. To use the squirt bottle, follow these steps: ? Before you urinate, fill the squirt bottle with warm water. The water should be warm. Do not use hot water. ? After you urinate, while you are sitting on the toilet, use the squirt bottle to rinse the area around your urethra and vaginal opening. This rinses away any urine and blood. ? You may do this instead of wiping. As you start healing, you may use the squirt bottle before wiping yourself. Make sure to wipe gently. ? Fill the squirt bottle with clean water every time you use the bathroom.  You will be given sanitary pads to wear.  Your incision will be monitored to make sure it is healing properly. You will be told when it is safe for your stitches, staples, or skin adhesive tape to be removed. What can I expect?  You may not feel the need to urinate for several hours after delivery.  You will have some soreness and pain in your abdomen. You may have a small amount of blood or clear fluid coming from your incision.  If you are breastfeeding, you may have uterine contractions every time you breastfeed for up to several weeks postpartum. Uterine contractions help your uterus return to  its normal size.  It is normal to have vaginal bleeding (lochia) after delivery. The amount and appearance of lochia is often similar to a menstrual period in the first week after delivery. It will gradually decrease over the next few weeks to a dry, yellow-brown discharge. For most women, lochia stops completely by 6-8 weeks after delivery. Vaginal bleeding can vary from woman to woman.  Within the first few days after delivery, you may have breast engorgement. This is when your breasts feel heavy, full, and uncomfortable. Your breasts may also throb and feel hard, tightly stretched, warm, and tender. After this occurs, you  may have milk leaking from your breasts.Your health care provider can help you relieve discomfort due to breast engorgement. Breast engorgement should go away within a few days.  You may feel more sad or worried than normal due to hormonal changes after delivery. These feelings should not last more than a few days. If these feelings do not go away after several days, speak with your health care provider. How should I care for myself?  Tell your health care provider if you have pain or discomfort.  Drink enough water to keep your urine clear or pale yellow.  Wash your hands thoroughly with soap and water for at least 20 seconds after changing your sanitary pads or using the toilet, and before holding or feeding your baby.  If you are not breastfeeding, avoid touching your breasts a lot. Doing this can make your breasts produce more milk.  If you become weak or lightheaded, or you feel like you might faint, ask for help before: ? Getting out of bed. ? Showering.  Change your sanitary pads frequently. Watch for any changes in your flow, such as a sudden increase in volume, a change in color, or the passing of large blood clots. If you pass a blood clot from your vagina, save it to show to your health care provider. Do not flush blood clots down the toilet without having your health care provider look at them.  Make sure that all your vaccinations are up to date. This can help protect you and your baby from getting certain diseases. You may need to have immunizations done before you leave the hospital.  If desired, talk with your health care provider about methods of family planning or birth control (contraception). How can I start bonding with my baby? Spending as much time as possible with your baby is very important. During this time, you and your baby can get to know each other and develop a bond. Having your baby stay with you in your room (rooming in) can give you time to get to know your  baby. Rooming in can also help you become comfortable caring for your baby. Breastfeeding can also help you bond with your baby. How can I plan for returning home with my baby?  Make sure that you have a car seat installed in your vehicle. ? Your car seat should be checked by a certified car seat installer to make sure that it is installed safely. ? Make sure that your baby fits into the car seat safely.  Ask your health care provider any questions you have about caring for yourself or your baby. Make sure that you are able to contact your health care provider with any questions after leaving the hospital. This information is not intended to replace advice given to you by your health care provider. Make sure you discuss any questions you have with your  health care provider. Document Released: 06/28/2012 Document Revised: 03/08/2016 Document Reviewed: 09/08/2015 Elsevier Interactive Patient Education  [employer].

## 2017-09-12 ENCOUNTER — Telehealth (HOSPITAL_COMMUNITY): Payer: Self-pay | Admitting: Lactation Services

## 2017-09-12 NOTE — Telephone Encounter (Signed)
Mom reports returned to work 2 weeks ago and noted a decrease in her milk supply. She was concerned that she would not have enough for the infant. 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 She attempted to pump over the weekend and got very little volume. She reports she pumped this morning and it seemed to be much better. Reviewed she may not get as much pumping when infant is also nursing as he wishes.   Enc mom to pump when away from infant on infant feeding schedule and to BF infant when she is with him. Discussed it is common for milk supply to decrease some when returning to work. Enc mom to relax with pumping and to have a picture or video of infant available with pumping. Discussed possibility of cycles returning now that she is pumping more and that hormone changes can decrease supply temporarily throughout the month also.   Discussed with mom that if supply decreases or remain low that we can discuss herbs that may help with supply. Mom was given [facility] phone number to call with any further questions/concerns.

## 2018-11-11 IMAGING — US US OB COMP LESS 14 WK
1 series · 15 of 28 positions shown · non-contrast
Comparison: None.

CLINICAL DATA: Lower abdominal pain with blood on tissue. Estimated
gestational age by LMP is 7 weeks 5 days. Quantitative beta HCG is
pending.

EXAM:
OBSTETRIC <14 WK ULTRASOUND
TECHNIQUE: Transabdominal ultrasound was performed for evaluation of the
gestation as well as the maternal uterus and adnexal regions.

[Series 1: us ob comp less 14 wk · 15 of 64 slices shown]
[im 1/64]
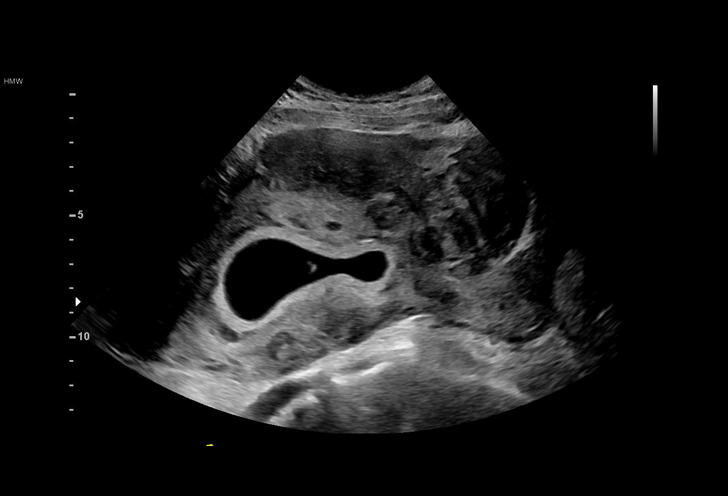
[im 5/64]
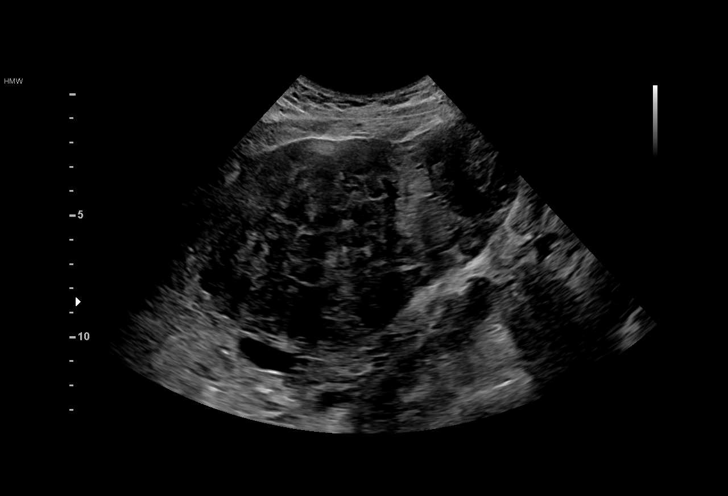
[im 10/64]
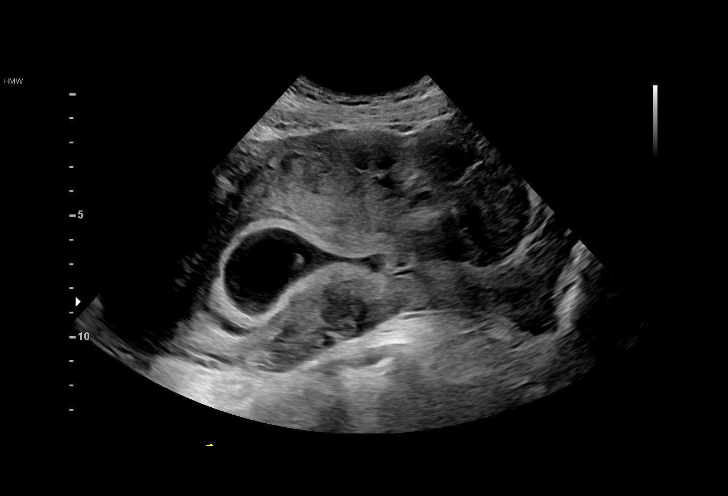
[im 15/64]
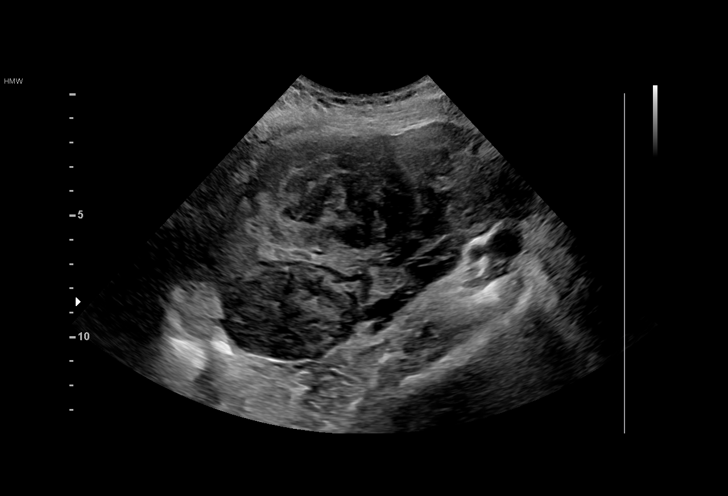
[im 19/64]
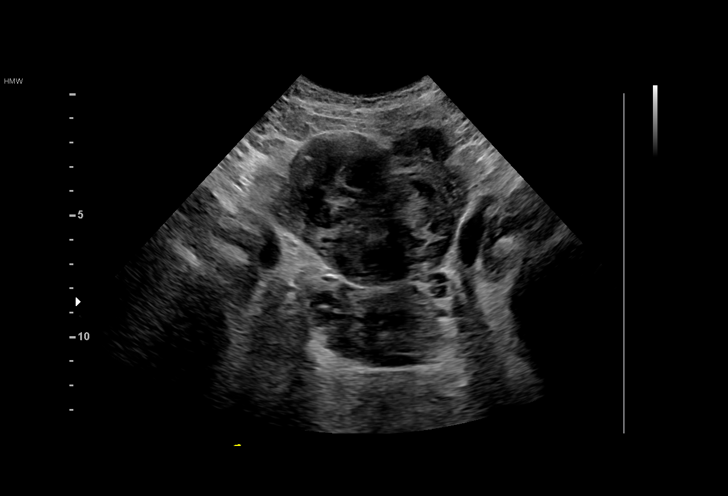
[im 24/64]
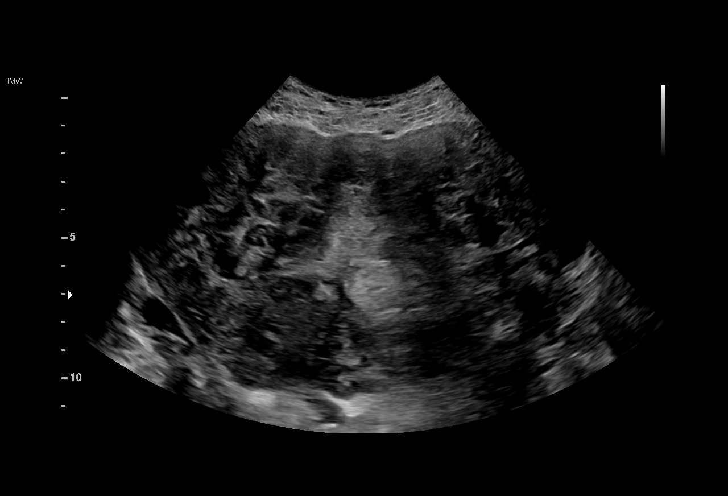
[im 29/64]
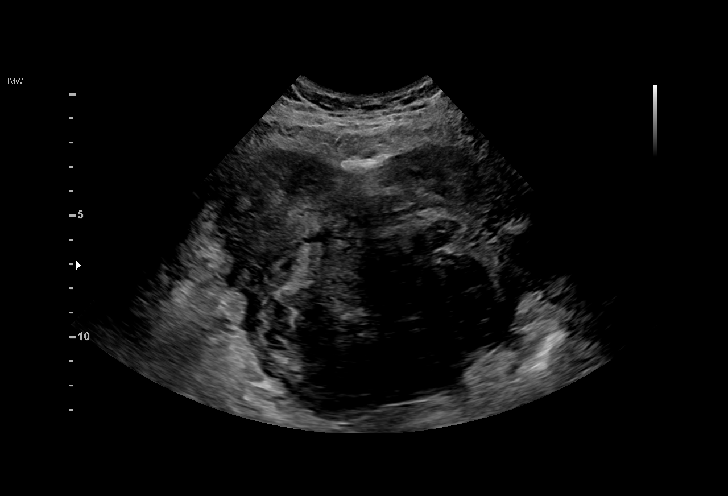
[im 33/64]
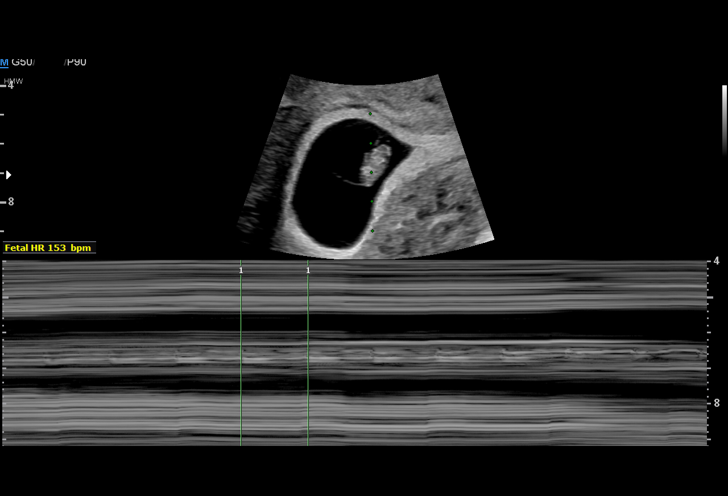
[im 36/64]
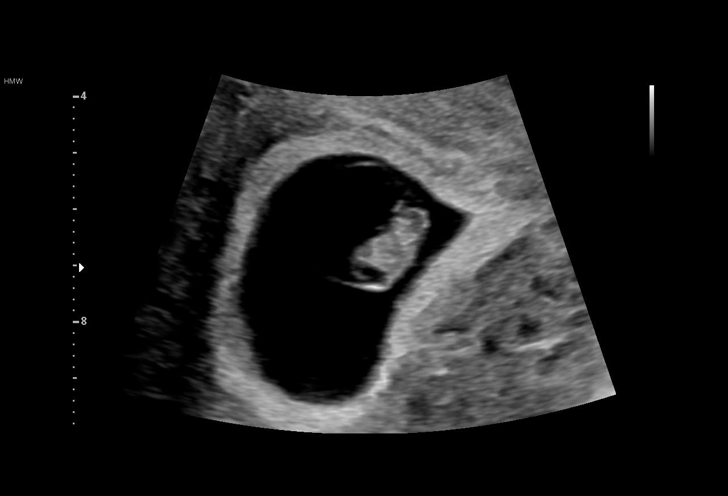
[im 40/64]
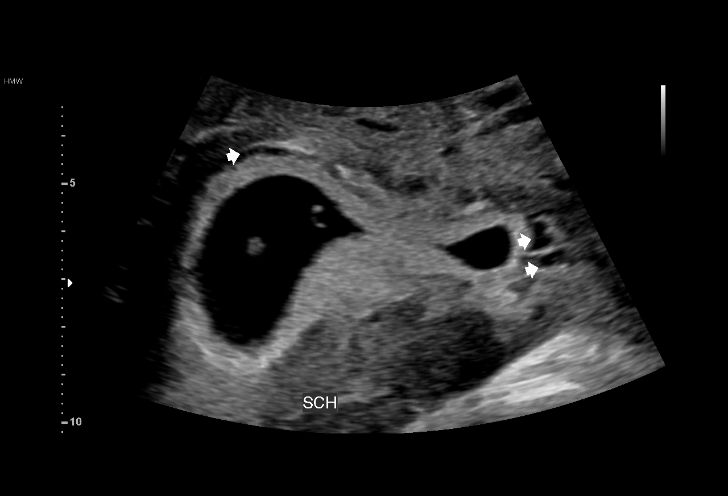
[im 45/64]
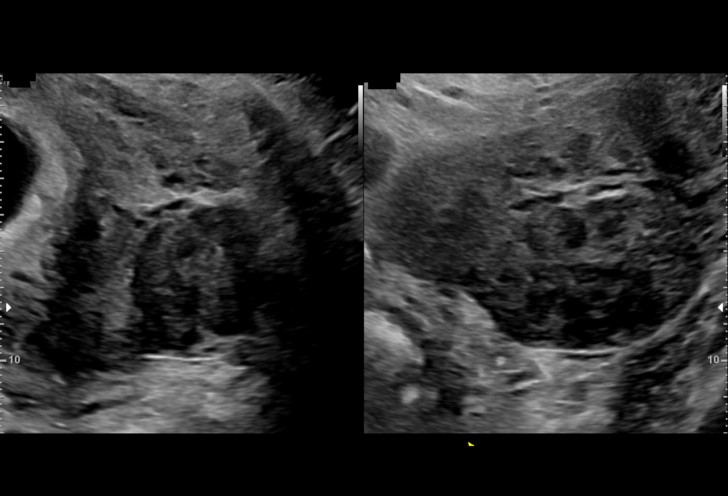
[im 50/64]
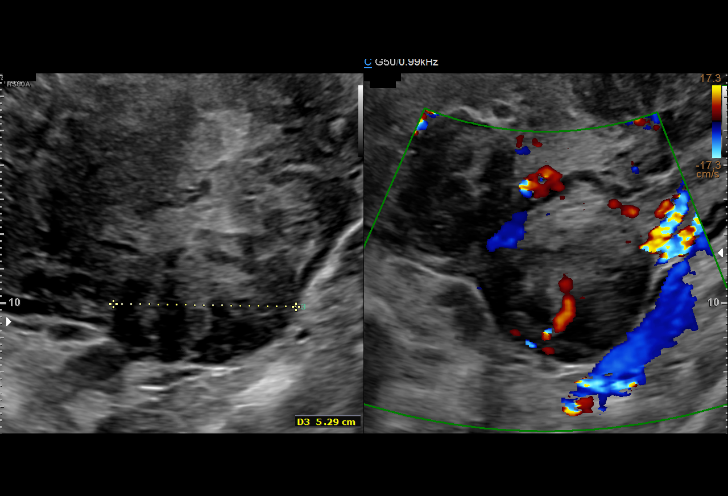
[im 54/64]
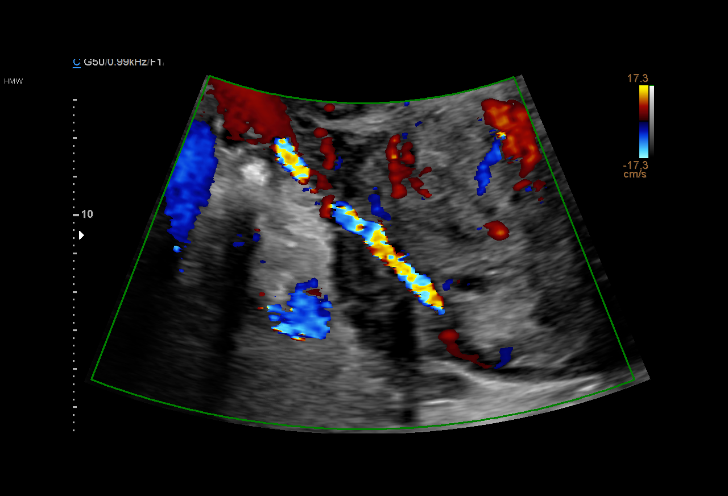
[im 59/64]
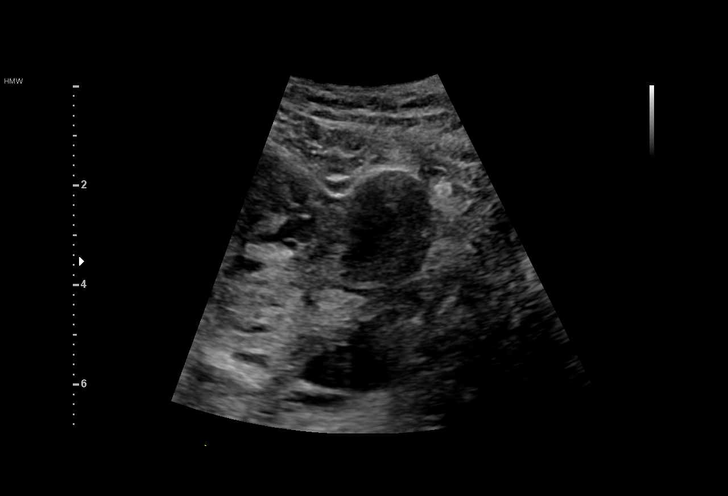
[im 64/64]
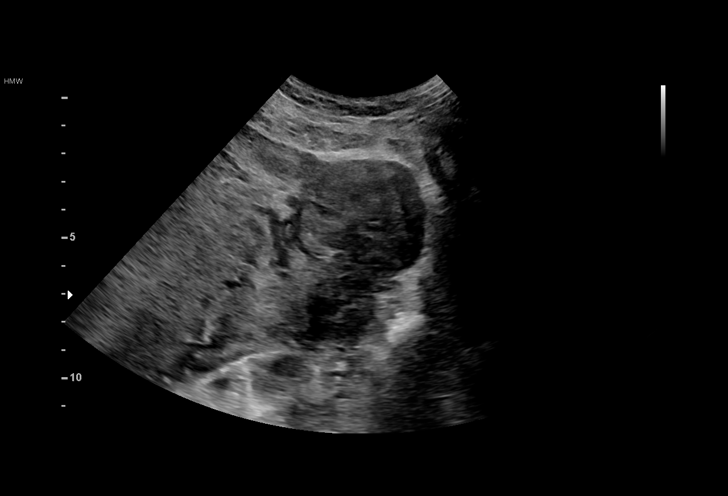

[15 of 28 positions shown; findings below may reference images not displayed]

FINDINGS: Intrauterine gestational sac: A single intrauterine gestational sac
is visualized.

Yolk sac:  Yolk sac is present.

Embryo:  Fetal pole is present.

Cardiac Activity: Fetal cardiac activity is observed.

Heart Rate: 153 bpm

CRL:   16.6  mm   8 w 0 d                  US EDC: 06/09/2017

Subchorionic hemorrhage: A small subchorionic hemorrhage is
demonstrated anteriorly.

Maternal uterus/adnexae: Uterus appears somewhat retroverted.
Diffuse heterogeneous nodular enlargement of the uterus consistent
with multiple uterine fibroids throughout all segments. Largest
individual fibroid is measured in the right fundal region at 9.8 cm
maximal diameter. Both ovaries are visualized and appear normal. No
free fluid is indicated.
IMPRESSION: Single intrauterine pregnancy. Estimated gestational age by
crown-rump length is 8 weeks 0 days. A small subchorionic hemorrhage
is noted. The uterus is diffusely enlarged and filled with multiple
fibroids, measuring up to 9.8 cm maximal diameter.

## 2019-01-11 IMAGING — US US MFM OB LIMITED
1 series · 15 of 28 positions shown · non-contrast
Comparison: none

[Series 1: us mfm ob limited · 15 of 28 slices shown]
[im 1/28]
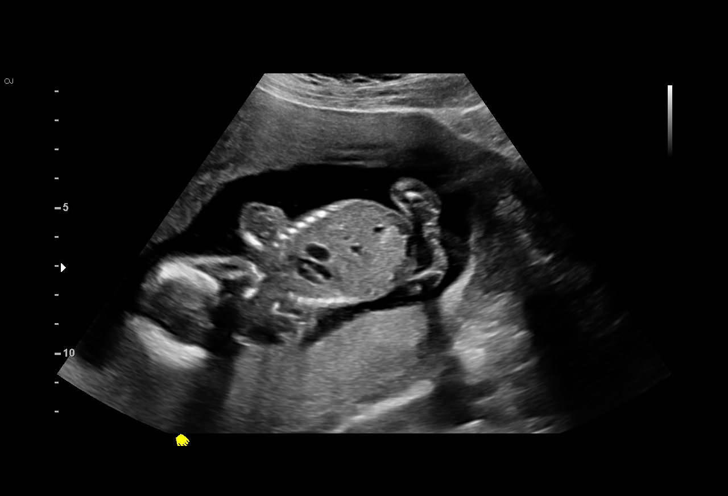
[im 3/28]
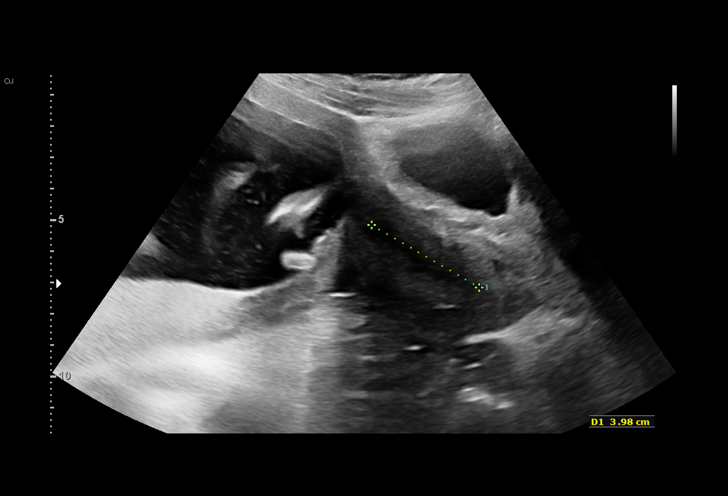
[im 5/28]
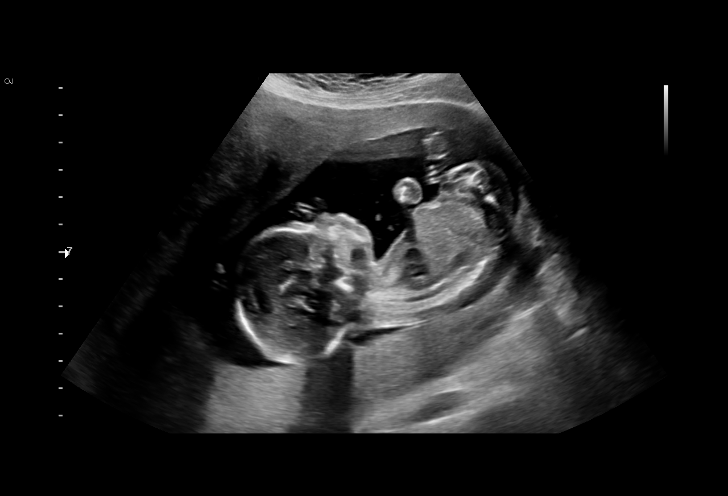
[im 7/28]
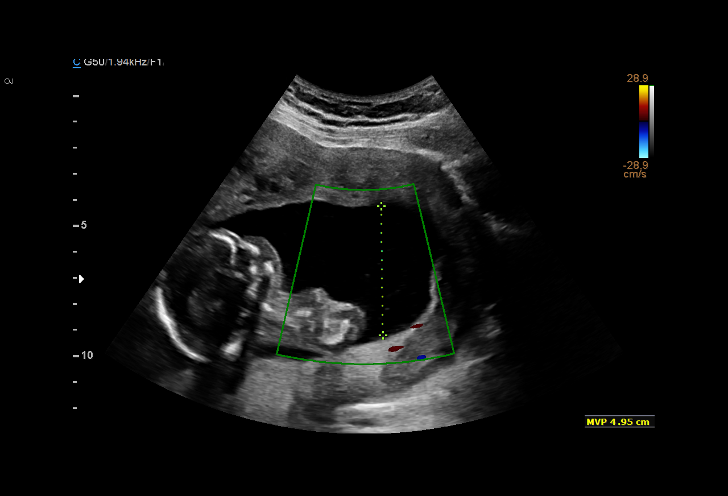
[im 9/28]
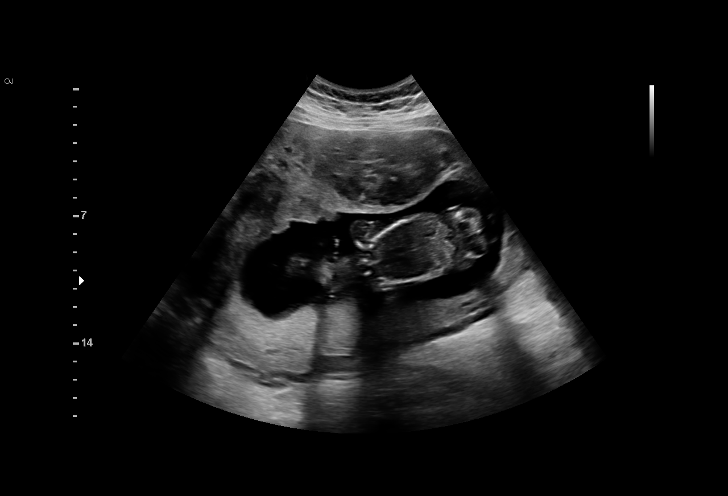
[im 11/28]
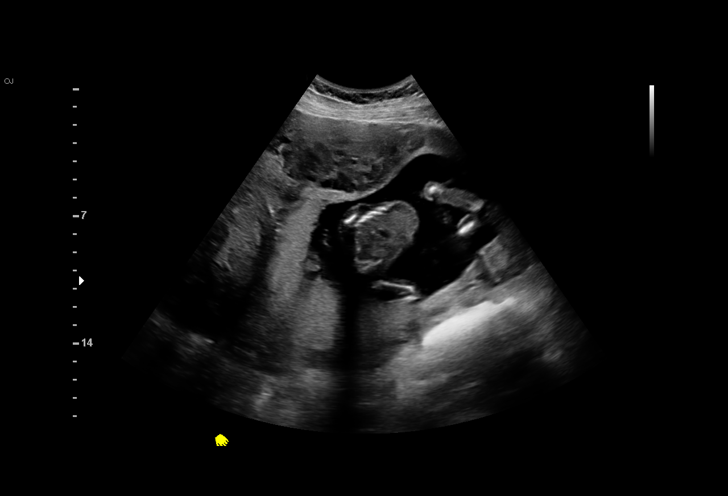
[im 13/28]
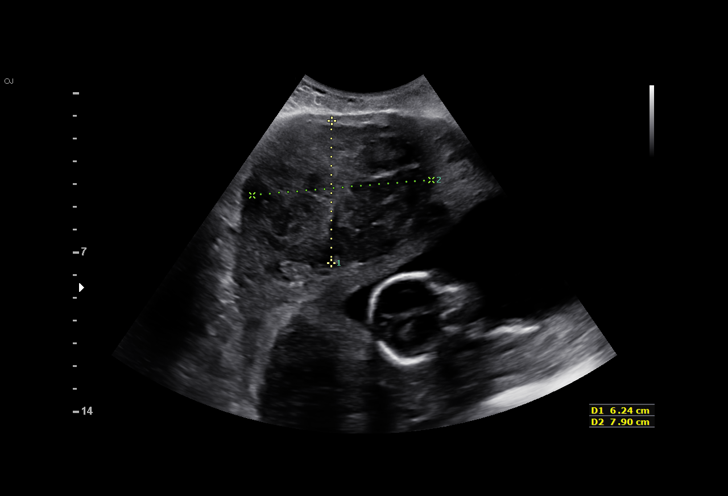
[im 15/28]
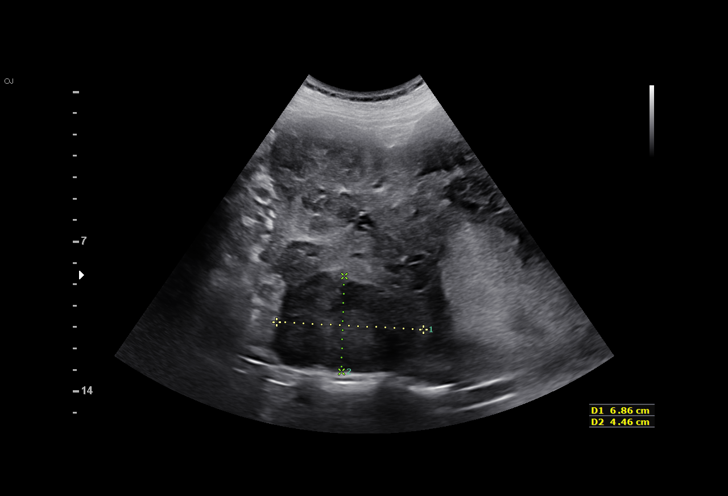
[im 16/28]
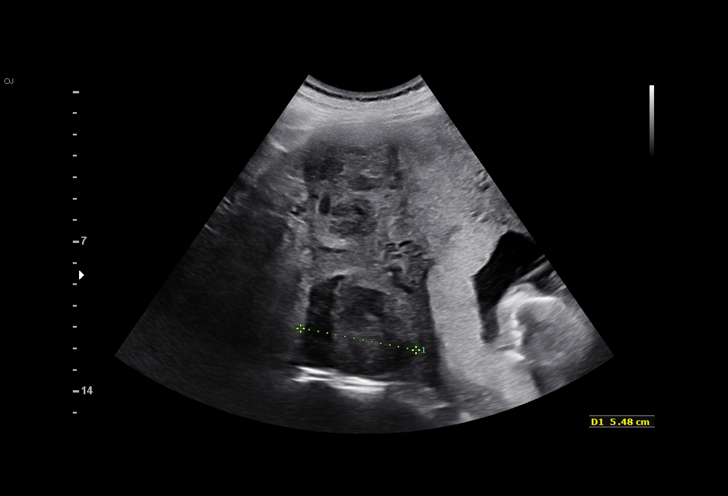
[im 18/28]
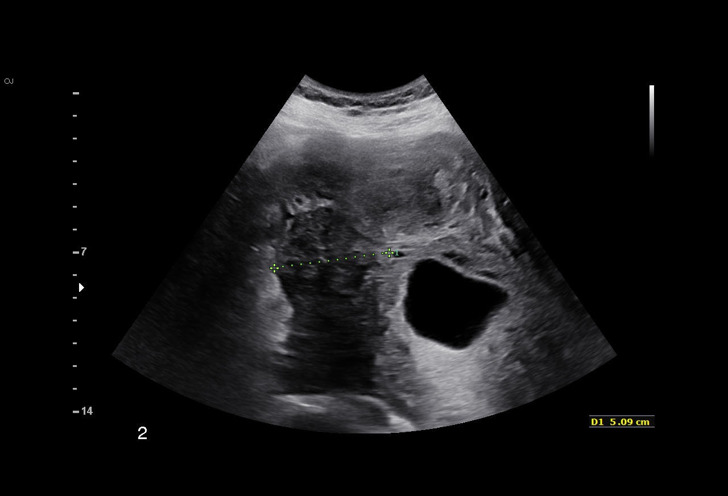
[im 20/28]
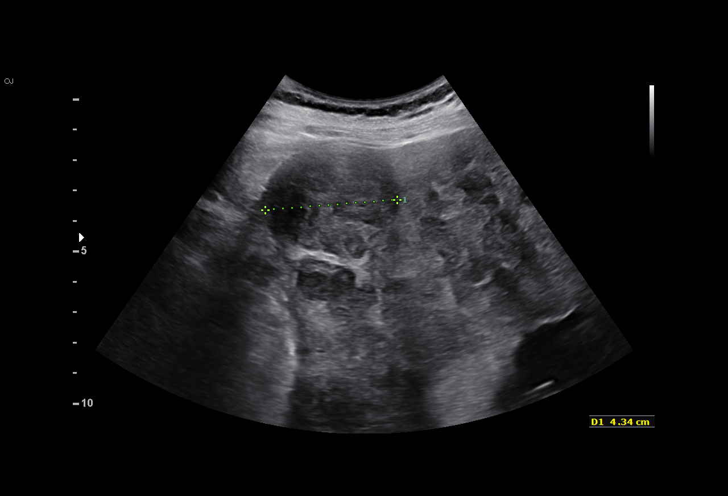
[im 22/28]
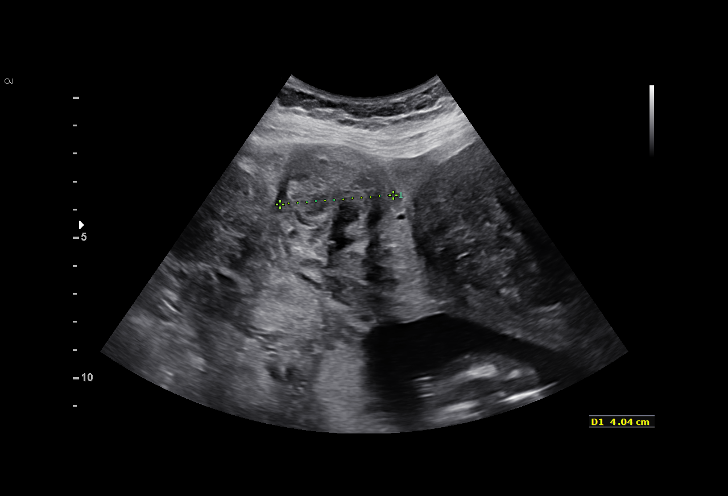
[im 24/28]
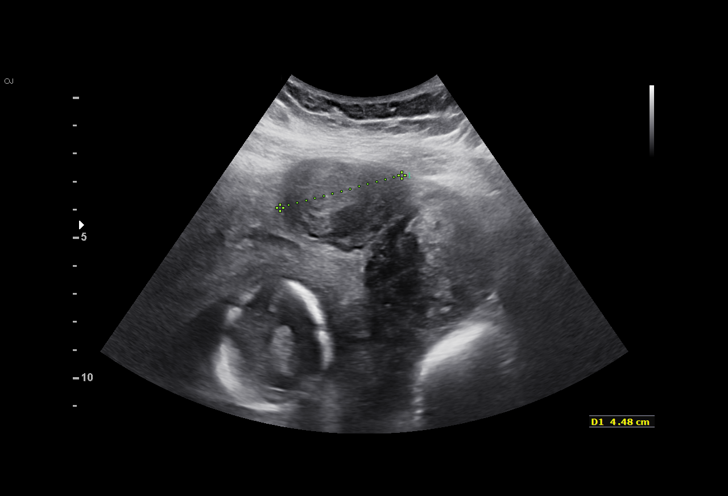
[im 26/28]
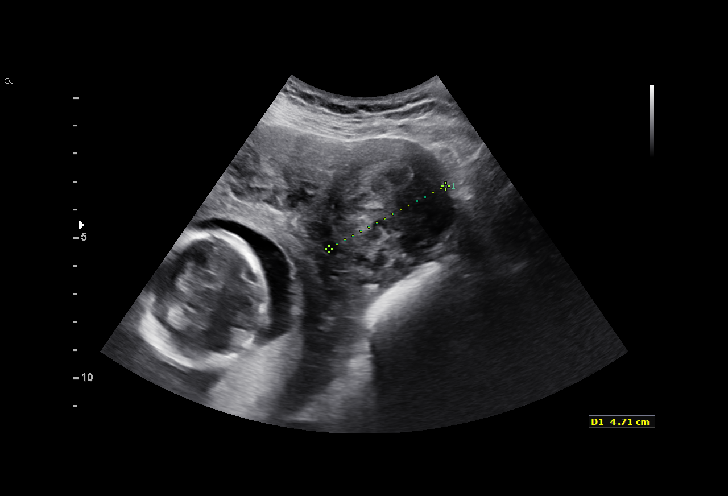
[im 28/28]
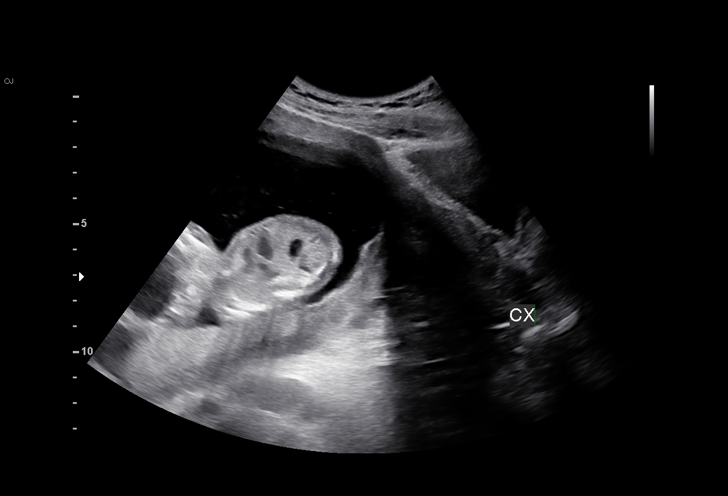

[15 of 28 positions shown; findings below may reference images not displayed]

Obstetrics &
Gynecology
[address]
[place].

1  ALIA TIGER              [id number]      [id number]     [id number]
Indications

16 weeks gestation of pregnancy
Uterine fibroids affecting pregnancy in        O34.12,
second trimester, antepartum
OB History

Gravidity:    1
Fetal Evaluation

Num Of Fetuses:     1
Fetal Heart         143
Rate(bpm):
Cardiac Activity:   Observed
Presentation:       Breech
Placenta:           Posterior, above cervical os
P. Cord Insertion:  Not well visualized

Amniotic Fluid
AFI FV:      Subjectively within normal limits

Largest Pocket(cm)
4.95
Gestational Age
LMP:           16w 3d       Date:   09/04/16                 EDD:   06/11/17
Best:          16w 3d    Det. By:   LMP  (09/04/16)          EDD:   06/11/17
Cervix Uterus Adnexa

Cervix
Length:              4  cm.
Normal appearance by transabdominal scan.

Uterus
Multiple fibroids noted, see table below.

Left Ovary
Not visualized. No adnexal mass visualized.

Right Ovary
Not visualized. No adnexal mass visualized.
Myomas

Site                     L(cm)      W(cm)      D(cm)      Location
Anterior
Posterior
Posterior                4.9        5.1        4
Rt fundal
Anterior                 4.5        4
LUS left                 4
LUS left

Blood Flow                 RI        PI       Comments

Impression

Single IUP at 16w 3d
Limited ultrasound performed to evaluate uterine myomas
Multiple uterine myomas again noted - essentially stable in
size since previous evaluation (see size / location above)
Normal amniotic fluid volume
Recommendations

Recommend follow-up ultrasound examination in 3 weeks for
anatomy / reevaluation of uterine myomas

## 2019-02-01 IMAGING — US US MFM OB DETAIL+14 WK
1 series · 14 of 28 positions shown · non-contrast
Comparison: none

[Series 1: us mfm ob detail+14 wk · 100 acquisitions, 14 frames shown]
[im 4/100]
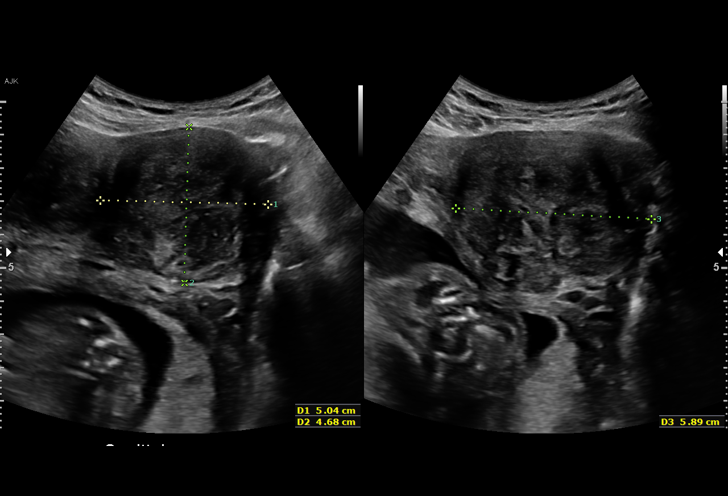
[im 12/100]
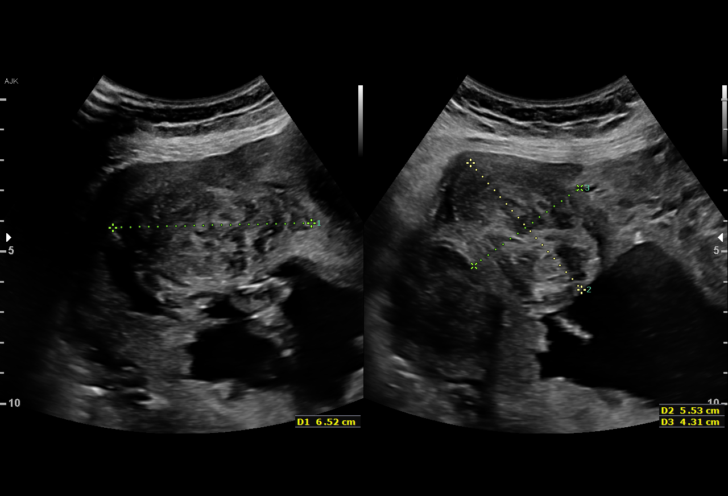
[im 19/100]
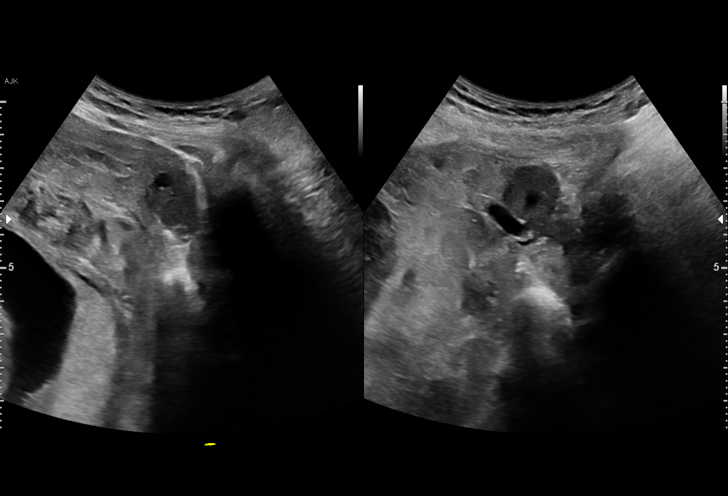
[im 26/100]
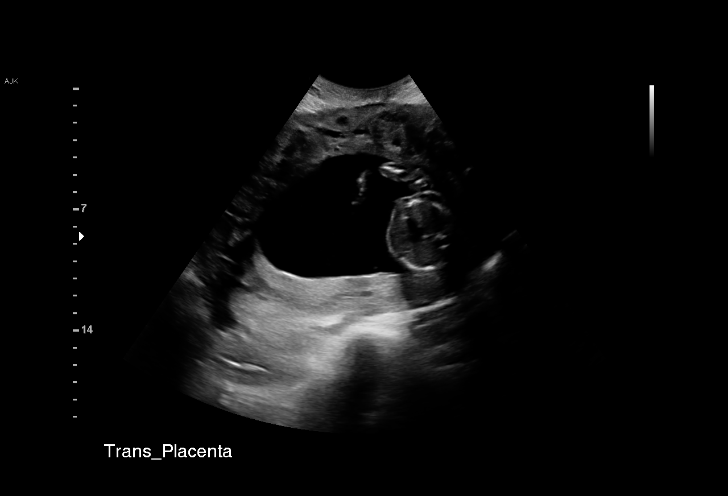
[im 34/100]
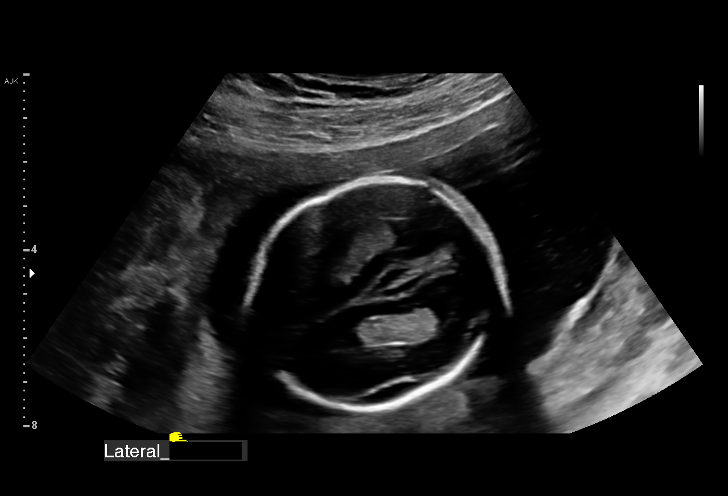
[im 41/100]
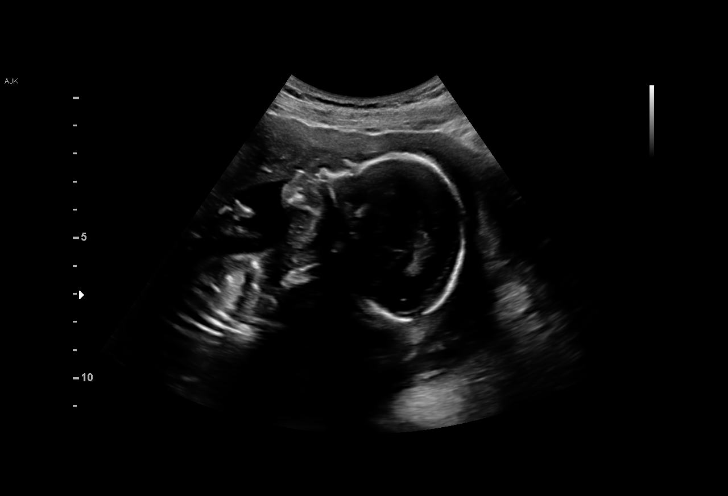
[im 48/100]
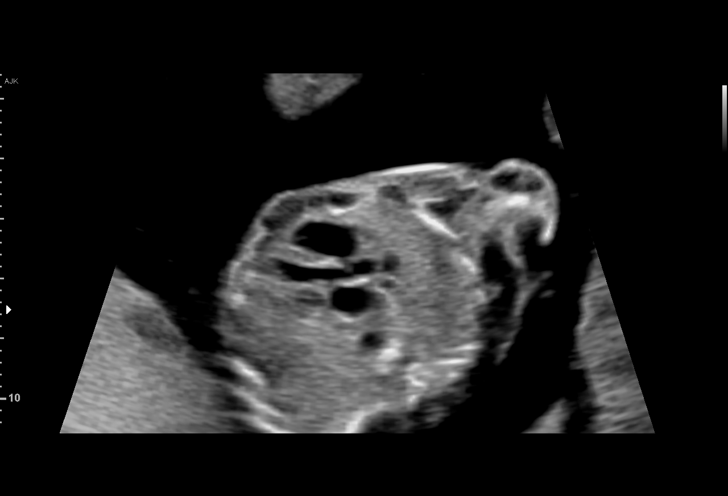
[im 56/100]
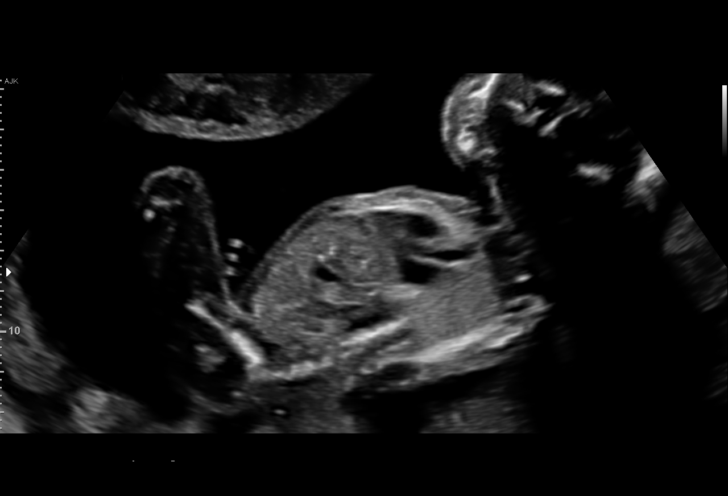
[im 63/100]
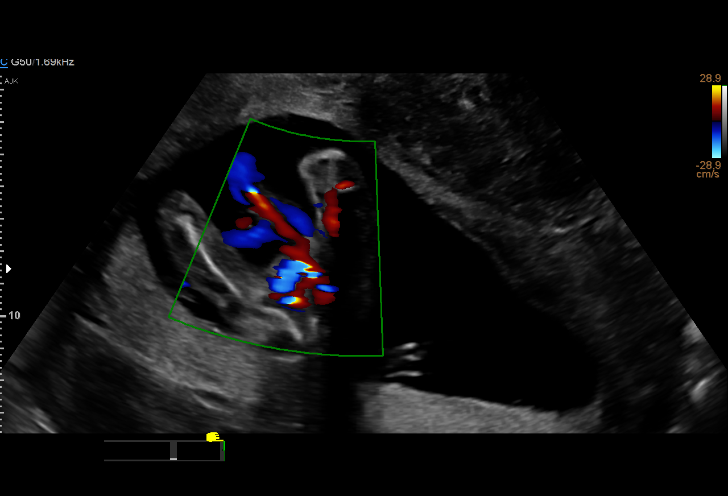
[im 70/100]
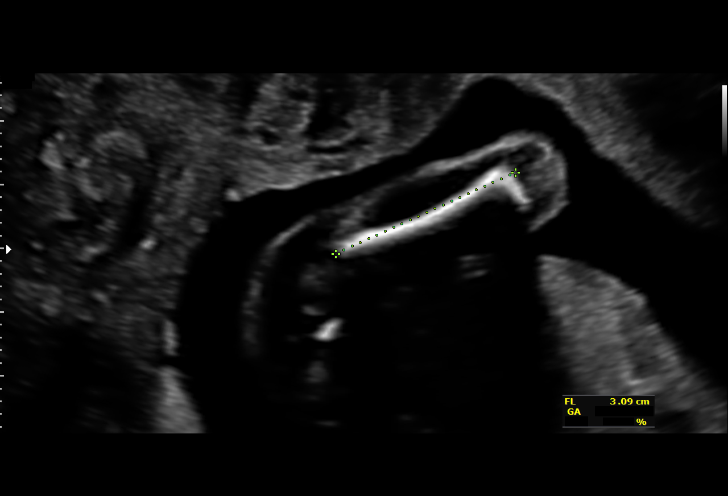
[im 78/100]
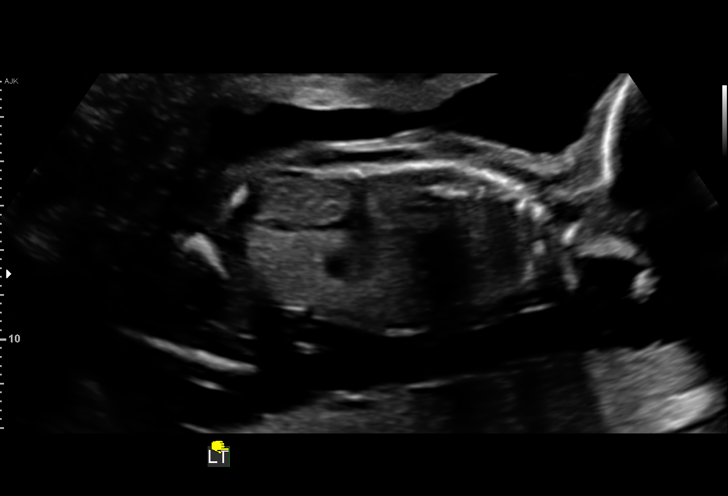
[im 85/100]
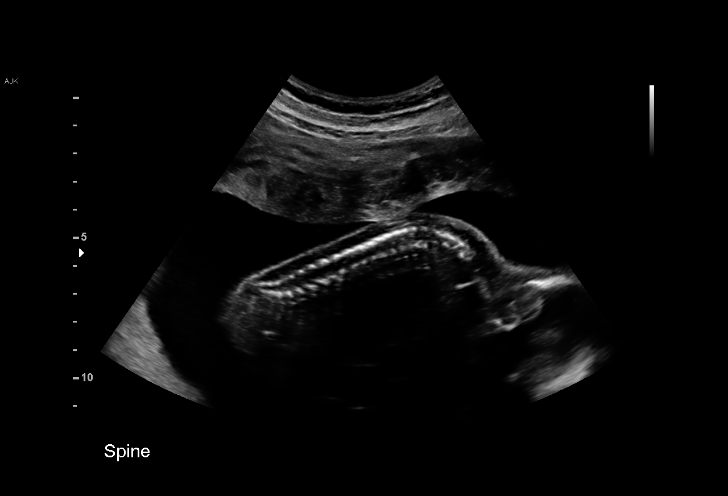
[im 92/100]
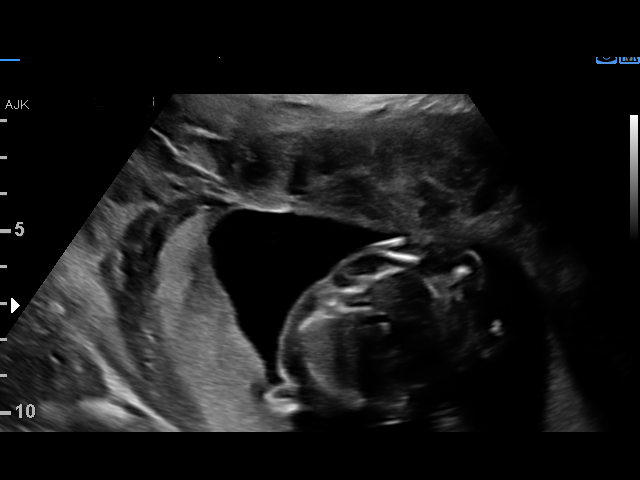
[im 100/100]
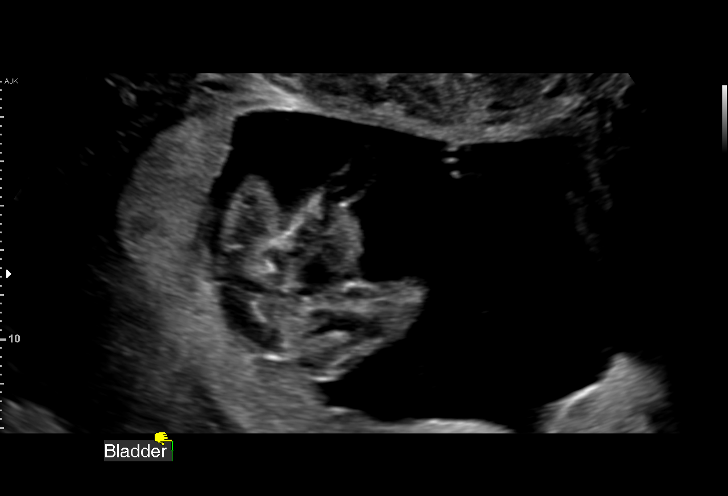

[14 of 28 positions shown; findings below may reference images not displayed]

Obstetrics &
Gynecology
[address]
[place].

1  CHAI TIGER            [id number]      [id number]     [id number]
Indications

19 weeks gestation of pregnancy
Uterine fibroids affecting pregnancy in first  O34.11,
trimester, antepartum
Encounter for fetal anatomic survey
OB History

Gravidity:    1
Fetal Evaluation

Num Of Fetuses:     1
Fetal Heart         141
Rate(bpm):
Cardiac Activity:   Observed
Presentation:       Cephalic
Placenta:           Posterior, above cervical os
P. Cord Insertion:  Not well visualized

Amniotic Fluid
AFI FV:      Subjectively within normal limits

Largest Pocket(cm)
5.02
Biometry
BPD:      51.8  mm     G. Age:  21w 5d       > 99  %    CI:        77.47   %   70 - 86
FL/HC:      16.3   %   16.1 -
HC:      186.3  mm     G. Age:  21w 0d         95  %    HC/AC:      1.16       1.09 -
AC:      160.8  mm     G. Age:  21w 1d         91  %    FL/BPD:     58.5   %
FL:       30.3  mm     G. Age:  19w 3d         41  %    FL/AC:      18.8   %   20 - 24
CER:      20.8  mm     G. Age:  19w 6d         57  %
NFT:       5.4  mm

CM:        5.8  mm
Est. FW:     355  gm    0 lb 13 oz      61  %
Gestational Age

LMP:           19w 3d       Date:   09/04/16                 EDD:   06/11/17
U/S Today:     20w 6d                                        EDD:   06/01/17
Best:          19w 3d    Det. By:   LMP  (09/04/16)          EDD:   06/11/17
Anatomy

Cranium:               Appears normal         Aortic Arch:            Appears normal
Cavum:                 Appears normal         Ductal Arch:            Appears normal
Ventricles:            Appears normal         Diaphragm:              Appears normal
Choroid Plexus:        Appears normal         Stomach:                Appears normal, left
sided
Cerebellum:            Appears normal         Abdomen:                Appears normal
Posterior Fossa:       Appears normal         Abdominal Wall:         Appears nml (cord
insert, abd wall)
Nuchal Fold:           Appears normal         Cord Vessels:           Appears normal (3
vessel cord)
Face:                  Appears normal         Kidneys:                Appear normal
(orbits and profile)
Lips:                  Appears normal         Bladder:                Appears normal
Thoracic:              Appears normal         Spine:                  Appears normal
Heart:                 Appears normal         Upper Extremities:      Appears normal
(4CH, axis, and situs
RVOT:                  Appears normal         Lower Extremities:      Appears normal
LVOT:                  Appears normal

Other:  Fetus appears to be a male. Parents do not wish to know sex of fetus.
Technically difficult due to fetal position and fibroids.
Cervix Uterus Adnexa

Cervix
Length:           5.19  cm.
Normal appearance by transabdominal scan.

Uterus
Multiple fibroids noted, see table below.

Left Ovary
Not visualized.

Right Ovary
Not visualized.

Adnexa:       No abnormality visualized.
Myomas
Site                     L(cm)      W(cm)      D(cm)      Location
Anterior                 8
Anterior left            5
Anterior LUS
Anterior (LUS)
Fundus
Fundus right
Posterior RIght

Blood Flow                 RI        PI       Comments

Impression

Single living intrauterine pregnancy at 86w6d.
Appropriate fetal growth (61%).
Normal amniotic fluid volume.
The fetal anatomic survey is complete.
Normal fetal anatomy.
No fetal anomalies or soft markers of aneuploidy seen.
Multiple fibroids again seen, stable in size.
Recommendations

Recommend serial ultrasounds for growth in the third
trimester given degree of fibroid burden.

## 2021-09-22 ENCOUNTER — Other Ambulatory Visit (HOSPITAL_COMMUNITY): Payer: Self-pay | Admitting: Obstetrics & Gynecology

## 2021-09-22 ENCOUNTER — Other Ambulatory Visit: Payer: Self-pay | Admitting: Obstetrics & Gynecology

## 2021-09-22 DIAGNOSIS — O021 Missed abortion: Secondary | ICD-10-CM

## 2021-09-23 ENCOUNTER — Encounter (HOSPITAL_COMMUNITY): Payer: Self-pay | Admitting: Obstetrics & Gynecology

## 2021-09-23 NOTE — Progress Notes (Signed)
DUE TO COVID-19 ONLY ONE VISITOR IS ALLOWED TO COME WITH YOU AND STAY IN THE WAITING ROOM ONLY DURING PRE OP AND PROCEDURE DAY OF SURGERY.   PCP - Dr Suzanna Obey Cardiologist - n/a  Chest x-ray - n/a EKG - 01/20/15 CE  Stress Test - n/a ECHO - n/a Cardiac Cath - n/a  ICD Pacemaker/Loop - n/a  Sleep Study -  n/a CPAP - none  STOP now taking any Aspirin (unless otherwise instructed by your surgeon), Aleve, Naproxen, Ibuprofen, Motrin, Advil, Goody's, BC's, all herbal medications, fish oil, and all vitamins.   Coronavirus Screening Covid test n/a Ambulatory Surgery  Do you have any of the following symptoms:  Cough yes/no: No Fever (>100.8F)  yes/no: No Runny nose yes/no: No Sore throat yes/no: No Difficulty breathing/shortness of breath  yes/no: No  Have you traveled in the last 14 days and where? yes/no: No  Patient verbalized understanding of instructions that were given via phone.

## 2021-09-24 DIAGNOSIS — Z8659 Personal history of other mental and behavioral disorders: Secondary | ICD-10-CM

## 2021-09-24 DIAGNOSIS — Z8759 Personal history of other complications of pregnancy, childbirth and the puerperium: Secondary | ICD-10-CM

## 2021-09-24 DIAGNOSIS — D219 Benign neoplasm of connective and other soft tissue, unspecified: Secondary | ICD-10-CM | POA: Diagnosis present

## 2021-09-24 DIAGNOSIS — D259 Leiomyoma of uterus, unspecified: Secondary | ICD-10-CM | POA: Diagnosis present

## 2021-09-24 HISTORY — DX: Personal history of other mental and behavioral disorders: Z86.59

## 2021-09-24 NOTE — H&P (Signed)
Karen Berg is a 35 y.o. female, [id number], presenting on 09/25/21 for scheduled D&E for a missed AB.  Patient is 9 weeks by LMP, 7 6/7 weeks by [place] on 09/21/21.  She presented to [facility] on 09/18/21 for routine missed period visit, with IUFD measuring 7 6/7 weeks.  She denied bleeding, cramping, or any other symptoms.  Options for management of the missed AB were reviewed with the patient, and she desires to proceed with scheduled D&E.  Her history is remarkable for multiple fibroids, which have been noted for a number of years--on her most recent US on 12/2, multiple fibroids were seen, largest 7 cm.  She has had a history of postpartum depression after her first delivery.  Blood type is B+ from prior records.  Case was posted for Dr. Alwyn Pea, with ultrasound guidance due to multiple fibroids. Patient was unsure about genetic testing of POC at the time of diagnosis.  Patient Active Problem List   Diagnosis Date Noted   Fibroids--multiple 09/24/2021   History of postpartum depression 09/24/2021   S/P primary low transverse C-section 06/09/2017   Eczema 01/20/2015    OB History     Gravida  2   Para  1   Term  1   Preterm      AB      Living  1      SAB      IAB      Ectopic      Multiple  0   Live Births  1         2018--primary LTCS due to [facility], multiple fibroids, female, 3670 gm, with [facility].  Past Medical History:  Diagnosis Date   Eczema    Family history of anesthesia complication    Mother had PONV   Fibroids    uterine fibroids   GERD (gastroesophageal reflux disease)    Migraine    Past Surgical History:  Procedure Laterality Date   CESAREAN SECTION N/A 06/09/2017   Procedure: CESAREAN SECTION;  Surgeon: Janyth Pupa, DO;  Location: [facility];  Service: Obstetrics;  Laterality: N/A;   CHOLECYSTECTOMY N/A 02/05/2014   Procedure: LAPAROSCOPIC CHOLECYSTECTOMY WITH INTRAOPERATIVE CHOLANGIOGRAM;  Surgeon: [name]. Georgette Dover, MD;  Location: [facility];   Service: General;  Laterality: N/A;   WISDOM TOOTH EXTRACTION     Family History: family history includes Cancer - Lung in an other family member; Cancer - Prostate in an other family member; Diabetes in an other family member.  Social History:  reports that she has never smoked. She has never used smokeless tobacco. She reports that she does not drink alcohol and does not use drugs.  She is married and employed full-time  ROS:  All sx WNL, denies bleeding, cramping, HA, N/V, or any other sx.  Allergies  Allergen Reactions   No Known Allergies        Height 5\' 1"  (1.549 m), weight 81.6 kg, last menstrual period 07/17/2021, unknown if currently breastfeeding.  Chest clear Heart RRR without murmur Abd gravid, NT, FH 11 weeks Pelvic: cervix closed Ext: WNL  LAB: ABO, Rh:  B+    Assessment/Plan: Missed AB Multiple fibroids  Plan: Admit to [facility] for scheduled D&E under US guidance by Dr. Alwyn Pea. Routine [facility] pre-op orders Offer genetic testing on POC, if patient desires.  Karen Berg, CNM, [place] 09/24/2021, 9:58 AM

## 2021-09-25 ENCOUNTER — Ambulatory Visit (HOSPITAL_COMMUNITY): Payer: 59 | Admitting: Anesthesiology

## 2021-09-25 ENCOUNTER — Ambulatory Visit (HOSPITAL_COMMUNITY)
Admission: RE | Admit: 2021-09-25 | Discharge: 2021-09-25 | Disposition: A | Discharge status: Home / Self Care | Payer: 59 | Source: Ambulatory Visit | Attending: Obstetrics & Gynecology | Admitting: Obstetrics & Gynecology

## 2021-09-25 ENCOUNTER — Ambulatory Visit (HOSPITAL_COMMUNITY)
Admission: RE | Admit: 2021-09-25 | Discharge: 2021-09-25 | Disposition: A | Discharge status: Home / Self Care | Payer: 59 | Attending: Obstetrics & Gynecology | Admitting: Obstetrics & Gynecology

## 2021-09-25 ENCOUNTER — Encounter (HOSPITAL_COMMUNITY): Payer: Self-pay | Admitting: Obstetrics & Gynecology

## 2021-09-25 ENCOUNTER — Other Ambulatory Visit: Payer: Self-pay

## 2021-09-25 ENCOUNTER — Encounter (HOSPITAL_COMMUNITY)
Admission: RE | Disposition: A | Discharge status: Home / Self Care | Payer: Self-pay | Source: Home / Self Care | Attending: Obstetrics & Gynecology

## 2021-09-25 DIAGNOSIS — D259 Leiomyoma of uterus, unspecified: Secondary | ICD-10-CM | POA: Diagnosis present

## 2021-09-25 DIAGNOSIS — Z3A09 9 weeks gestation of pregnancy: Secondary | ICD-10-CM | POA: Diagnosis not present

## 2021-09-25 DIAGNOSIS — O021 Missed abortion: Secondary | ICD-10-CM | POA: Insufficient documentation

## 2021-09-25 DIAGNOSIS — Z8659 Personal history of other mental and behavioral disorders: Secondary | ICD-10-CM

## 2021-09-25 DIAGNOSIS — D219 Benign neoplasm of connective and other soft tissue, unspecified: Secondary | ICD-10-CM | POA: Diagnosis present

## 2021-09-25 DIAGNOSIS — Z8759 Personal history of other complications of pregnancy, childbirth and the puerperium: Secondary | ICD-10-CM

## 2021-09-25 HISTORY — PX: OPERATIVE ULTRASOUND: SHX5996

## 2021-09-25 HISTORY — PX: DILATION AND EVACUATION: SHX1459

## 2021-09-25 LAB — CBC
HCT: 34.5 % — ABNORMAL LOW (ref 36.0–46.0)
Hemoglobin: 11.4 g/dL — ABNORMAL LOW (ref 12.0–15.0)
MCH: 30.7 pg (ref 26.0–34.0)
MCHC: 33 g/dL (ref 30.0–36.0)
MCV: 93 fL (ref 80.0–100.0)
Platelets: 291 10*3/uL (ref 150–400)
RBC: 3.71 MIL/uL — ABNORMAL LOW (ref 3.87–5.11)
RDW: 12.5 % (ref 11.5–15.5)
WBC: 5.9 10*3/uL (ref 4.0–10.5)
nRBC: 0 % (ref 0.0–0.2)

## 2021-09-25 LAB — TYPE AND SCREEN
ABO/RH(D): O POS
Antibody Screen: NEGATIVE

## 2021-09-25 SURGERY — DILATION AND EVACUATION, UTERUS
Anesthesia: General | Site: Uterus

## 2021-09-25 MED ORDER — DIPHENHYDRAMINE HCL 50 MG/ML IJ SOLN
INTRAMUSCULAR | Status: DC | PRN
  Administered 2021-09-25: 12.5 mg via INTRAVENOUS

## 2021-09-25 MED ORDER — ACETAMINOPHEN 10 MG/ML IV SOLN
INTRAVENOUS | Status: DC | PRN
  Administered 2021-09-25: 1000 mg via INTRAVENOUS

## 2021-09-25 MED ORDER — FENTANYL CITRATE (PF) 250 MCG/5ML IJ SOLN
INTRAMUSCULAR | Status: DC | PRN
  Administered 2021-09-25: 25 ug via INTRAVENOUS
  Administered 2021-09-25: 50 ug via INTRAVENOUS
  Administered 2021-09-25: 25 ug via INTRAVENOUS

## 2021-09-25 MED ORDER — 0.9 % SODIUM CHLORIDE (POUR BTL) OPTIME
TOPICAL | Status: DC | PRN
  Administered 2021-09-25: 1000 mL

## 2021-09-25 MED ORDER — ACETAMINOPHEN 10 MG/ML IV SOLN
INTRAVENOUS | Status: AC
  Filled 2021-09-25: qty 100

## 2021-09-25 MED ORDER — KETOROLAC TROMETHAMINE 30 MG/ML IJ SOLN
INTRAMUSCULAR | Status: DC | PRN
  Administered 2021-09-25: 30 mg via INTRAVENOUS

## 2021-09-25 MED ORDER — HYDROCODONE-ACETAMINOPHEN 5-325 MG PO TABS: 1.0000 | ORAL_TABLET | Freq: Four times a day (QID) | ORAL | 0 refills | Status: DC | PRN

## 2021-09-25 MED ORDER — LIDOCAINE HCL (PF) 1 % IJ SOLN
INTRAMUSCULAR | Status: AC
  Filled 2021-09-25: qty 30

## 2021-09-25 MED ORDER — POVIDONE-IODINE 10 % EX SWAB
2.0000 "application " | Freq: Once | CUTANEOUS | Status: AC
  Administered 2021-09-25: 2 via TOPICAL

## 2021-09-25 MED ORDER — FENTANYL CITRATE (PF) 250 MCG/5ML IJ SOLN
INTRAMUSCULAR | Status: AC
  Filled 2021-09-25: qty 5

## 2021-09-25 MED ORDER — ONDANSETRON HCL 4 MG/2ML IJ SOLN
INTRAMUSCULAR | Status: DC | PRN
  Administered 2021-09-25: 4 mg via INTRAVENOUS

## 2021-09-25 MED ORDER — PROPOFOL 10 MG/ML IV BOLUS
INTRAVENOUS | Status: DC | PRN
  Administered 2021-09-25: 200 mg via INTRAVENOUS
  Administered 2021-09-25: 20 mg via INTRAVENOUS

## 2021-09-25 MED ORDER — PROPOFOL 10 MG/ML IV BOLUS
INTRAVENOUS | Status: AC
  Filled 2021-09-25: qty 20

## 2021-09-25 MED ORDER — MIDAZOLAM HCL 5 MG/5ML IJ SOLN
INTRAMUSCULAR | Status: DC | PRN
  Administered 2021-09-25: 2 mg via INTRAVENOUS

## 2021-09-25 MED ORDER — SILVER NITRATE-POT NITRATE 75-25 % EX MISC
CUTANEOUS | Status: DC | PRN
Start: 2021-09-25 — End: 2021-09-25
  Administered 2021-09-25 (×2): 1

## 2021-09-25 MED ORDER — LIDOCAINE 2% (20 MG/ML) 5 ML SYRINGE
INTRAMUSCULAR | Status: DC | PRN
  Administered 2021-09-25: 100 mg via INTRAVENOUS

## 2021-09-25 MED ORDER — SILVER NITRATE-POT NITRATE 75-25 % EX MISC
CUTANEOUS | Status: AC
  Filled 2021-09-25: qty 10

## 2021-09-25 MED ORDER — DOXYCYCLINE MONOHYDRATE 100 MG PO CAPS: 100.0000 mg | ORAL_CAPSULE | Freq: Two times a day (BID) | ORAL | 0 refills | Status: AC

## 2021-09-25 MED ORDER — LIDOCAINE HCL 1 % IJ SOLN
INTRAMUSCULAR | Status: DC | PRN
  Administered 2021-09-25: 11 mL

## 2021-09-25 MED ORDER — LACTATED RINGERS IV SOLN: INTRAVENOUS | Status: DC

## 2021-09-25 MED ORDER — IBUPROFEN 800 MG PO TABS: 800.0000 mg | ORAL_TABLET | Freq: Three times a day (TID) | ORAL | 4 refills | Status: DC | PRN

## 2021-09-25 MED ORDER — DEXAMETHASONE SODIUM PHOSPHATE 10 MG/ML IJ SOLN
INTRAMUSCULAR | Status: DC | PRN
  Administered 2021-09-25: 4 mg via INTRAVENOUS

## 2021-09-25 MED ORDER — LIDOCAINE 2% (20 MG/ML) 5 ML SYRINGE
INTRAMUSCULAR | Status: AC
  Filled 2021-09-25: qty 5

## 2021-09-25 MED ORDER — MIDAZOLAM HCL 2 MG/2ML IJ SOLN
INTRAMUSCULAR | Status: AC
  Filled 2021-09-25: qty 2

## 2021-09-25 MED ORDER — CHLORHEXIDINE GLUCONATE 0.12 % MT SOLN
OROMUCOSAL | Status: AC
  Administered 2021-09-25: 15 mL
  Filled 2021-09-25: qty 15

## 2021-09-25 MED ORDER — SODIUM CHLORIDE 0.9 % IV SOLN
100.0000 mg | Freq: Once | INTRAVENOUS | Status: AC
Start: 2021-09-25 — End: 2021-09-25
  Administered 2021-09-25: 100 mg via INTRAVENOUS
  Filled 2021-09-25: qty 100

## 2021-09-25 SURGICAL SUPPLY — 21 items
CATH ROBINSON RED A/P 16FR (CATHETERS) ×3 IMPLANT
DECANTER SPIKE VIAL GLASS SM (MISCELLANEOUS) ×3 IMPLANT
DILATOR CANAL MILEX (MISCELLANEOUS) IMPLANT
FILTER UTR ASPR ASSEMBLY (MISCELLANEOUS) ×3 IMPLANT
GLOVE SURG ENC MOIS LTX SZ6.5 (GLOVE) ×3 IMPLANT
GLOVE SURG UNDER POLY LF SZ7 (GLOVE) ×6 IMPLANT
GOWN STRL REUS W/ TWL LRG LVL3 (GOWN DISPOSABLE) ×4 IMPLANT
GOWN STRL REUS W/TWL LRG LVL3 (GOWN DISPOSABLE) ×6
HOSE CONNECTING 18IN BERKELEY (TUBING) ×3 IMPLANT
KIT BERKELEY 1ST TRI 3/8 NO TR (MISCELLANEOUS) ×3 IMPLANT
KIT BERKELEY 1ST TRIMESTER 3/8 (MISCELLANEOUS) ×3 IMPLANT
NS IRRIG 1000ML POUR BTL (IV SOLUTION) ×3 IMPLANT
PACK VAGINAL MINOR WOMEN LF (CUSTOM PROCEDURE TRAY) ×3 IMPLANT
PAD OB MATERNITY 4.3X12.25 (PERSONAL CARE ITEMS) ×3 IMPLANT
SET BERKELEY SUCTION TUBING (SUCTIONS) ×3 IMPLANT
TOWEL GREEN STERILE FF (TOWEL DISPOSABLE) ×6 IMPLANT
UNDERPAD 30X36 HEAVY ABSORB (UNDERPADS AND DIAPERS) ×3 IMPLANT
VACURETTE 10 RIGID CVD (CANNULA) IMPLANT
VACURETTE 7MM CVD STRL WRAP (CANNULA) IMPLANT
VACURETTE 8 RIGID CVD (CANNULA) ×1 IMPLANT
VACURETTE 9 RIGID CVD (CANNULA) IMPLANT

## 2021-09-25 NOTE — Anesthesia Procedure Notes (Addendum)
Procedure Name: LMA Insertion Date/Time: 09/25/2021 1:15 PM Performed by: Renato Shin, CRNA Pre-anesthesia Checklist: Patient identified, Emergency Drugs available, Suction available and Patient being monitored Patient Re-evaluated:Patient Re-evaluated prior to induction Oxygen Delivery Method: Circle system utilized Preoxygenation: Pre-oxygenation with 100% oxygen Induction Type: IV induction Ventilation: Mask ventilation without difficulty LMA: LMA inserted LMA Size: 4.0 Number of attempts: 1 Placement Confirmation: positive ETCO2 and breath sounds checked- equal and bilateral Tube secured with: Tape Dental Injury: Teeth and Oropharynx as per pre-operative assessment

## 2021-09-25 NOTE — Anesthesia Preprocedure Evaluation (Signed)
Anesthesia Evaluation  Patient identified by MRN, date of birth, ID band Patient awake    Reviewed: Allergy & Precautions, NPO status , Patient's Chart, lab work & pertinent test results  Airway Mallampati: II       Dental   Pulmonary    breath sounds clear to auscultation       Cardiovascular  Rhythm:Regular Rate:Normal  Hx noted Dr. Nyoka Cowden   Neuro/Psych  Headaches,    GI/Hepatic Neg liver ROS, GERD  ,  Endo/Other  negative endocrine ROS  Renal/GU negative Renal ROS     Musculoskeletal   Abdominal   Peds  Hematology   Anesthesia Other Findings   Reproductive/Obstetrics                             Anesthesia Physical Anesthesia Plan  ASA: 3  Anesthesia Plan: General   Post-op Pain Management:    Induction: Intravenous  PONV Risk Score and Plan: 3 and Ondansetron, Propofol infusion and Midazolam  Airway Management Planned: Oral ETT  Additional Equipment:   Intra-op Plan:   Post-operative Plan: Extubation in OR  Informed Consent: I have reviewed the patients History and Physical, chart, labs and discussed the procedure including the risks, benefits and alternatives for the proposed anesthesia with the patient or authorized representative who has indicated his/her understanding and acceptance.     Dental advisory given  Plan Discussed with: CRNA and Anesthesiologist  Anesthesia Plan Comments:         Anesthesia Quick Evaluation

## 2021-09-25 NOTE — Op Note (Signed)
Operative Report  Karen Berg  DOB:    19-Jan-1986  MRN:    [id number]  Date of Surgery:  09/25/2021  Preoperative Diagnosis: 9 week missed ab  Postoperative Diagnosis: Same as above  Procedure: [place], OPERATIVE ULTRASOUND  Surgeon: Mady Haagensen. [name] MD  Assistant: None  Anesthetic: General  Estimated Blood Loss:  less than 50 mL         Drains:  None (Red rubber straight cath prior to procedure           Total IV Fluids: 700 ml LR  Blood Given: none          Specimens: Products of conception              Complications:  * No complications entered in OR log *         Disposition: PACU - hemodynamically stable.         Condition: stable    Indication: 9 week missed abortion as documented on office ultrasound. CRL measuring 7 weeks. Patient understands the indications for surgical procedure.  She also understands the alternative treatment options. She accepts the risk of, but not limited to, anesthetic complications, bleeding, infections, and possible damage to the surrounding organs.        Findings: Exam under anesthesia: External vulva normal; vaginal vault normal, parous cervix grossly normal.  Uterus anteverted with multiple uterine fibroids palpable measuring 14 weeks by palpation, mobile. Ultrasound showed multiple submucosal fibroids large fundal fibroid. Moderate amount of products of conception were evacuated.        Technique:  Patient was placed in dorsal lithotomy position in [employer]. After adequate anesthesia was achieved, the patient was prepped and draped in the usual sterile fashion. The operative Graves speculum was placed in the vagina and the cervix stabilized with a single-tooth tenaculum.  A paracervical block using 1% lidocaine was performed. The cervix was dilated with Kennon Rounds dilators and this was done under abdominal ultrasound guidance ensuring entry into the endometrial cavity. The 8 mm curette was used to remove  contents of the uterus, again ensuring the curette was entering the endometrium and the products of conception were being evacuated. Alternating sharp curettage with a curette and suction curettage was performed until all contents were removed.and good crie was achieved.  All instruments were removed from the vagina.  The patient tolerated the procedure well.    Disposition: PACU - hemodynamically stable.         Condition: stable  [employer]

## 2021-09-25 NOTE — Anesthesia Postprocedure Evaluation (Signed)
Anesthesia Post Note  Patient: Interior and spatial designer  Procedure(s) Performed: DILATATION AND EVACUATION (Uterus) OPERATIVE ULTRASOUND (Abdomen)     Patient location during evaluation: PACU Anesthesia Type: General Level of consciousness: awake Pain management: pain level controlled Vital Signs Assessment: post-procedure vital signs reviewed and stable Cardiovascular status: stable Postop Assessment: no apparent nausea or vomiting Anesthetic complications: no   No notable events documented.  Last Vitals:  Vitals:   09/25/21 1407 09/25/21 1422  BP: 116/78 122/77  Pulse: 88 89  Resp: (!) 24 (!) 22  Temp:  (!) 36.3 C  SpO2: 95% 96%    Last Pain:  Vitals:   09/25/21 1422  TempSrc:   PainSc: 0-No pain                 Ignacio Lowder

## 2021-09-25 NOTE — Transfer of Care (Signed)
Immediate Anesthesia Transfer of Care Note  Patient: Karen Berg  Procedure(s) Performed: DILATATION AND EVACUATION (Uterus) OPERATIVE ULTRASOUND (Abdomen)  Patient Location: PACU  Anesthesia Type:General  Level of Consciousness: drowsy and patient cooperative  Airway & Oxygen Therapy: Patient Spontanous Breathing and Patient connected to face mask oxygen  Post-op Assessment: Report given to RN and Post -op Vital signs reviewed and stable  Post vital signs: Reviewed and stable  Last Vitals:  Vitals Value Taken Time  BP 119/64 09/25/21 1352  Temp    Pulse 87 09/25/21 1355  Resp 24 09/25/21 1355  SpO2 100 % 09/25/21 1355  Vitals shown include unvalidated device data.  Last Pain:  Vitals:   09/25/21 1108  TempSrc:   PainSc: 0-No pain         Complications: No notable events documented.

## 2021-09-26 ENCOUNTER — Encounter (HOSPITAL_COMMUNITY): Payer: Self-pay | Admitting: Obstetrics & Gynecology

## 2021-09-28 LAB — SURGICAL PATHOLOGY

## 2022-03-02 ENCOUNTER — Encounter: Payer: Self-pay | Admitting: Internal Medicine

## 2022-03-11 ENCOUNTER — Ambulatory Visit
Admission: EM | Admit: 2022-03-11 | Discharge: 2022-03-11 | Disposition: A | Discharge status: Home / Self Care | Payer: 59 | Attending: Emergency Medicine | Admitting: Emergency Medicine

## 2022-03-11 ENCOUNTER — Encounter: Payer: Self-pay | Admitting: Emergency Medicine

## 2022-03-11 DIAGNOSIS — J302 Other seasonal allergic rhinitis: Secondary | ICD-10-CM | POA: Diagnosis not present

## 2022-03-11 DIAGNOSIS — J029 Acute pharyngitis, unspecified: Secondary | ICD-10-CM | POA: Diagnosis not present

## 2022-03-11 LAB — POCT RAPID STREP A (OFFICE): Rapid Strep A Screen: NEGATIVE

## 2022-03-11 MED ORDER — AZELASTINE HCL 0.1 % NA SOLN: 2.0000 | Freq: Two times a day (BID) | NASAL | 0 refills | Status: DC

## 2022-03-11 MED ORDER — LORATADINE 10 MG PO TABS: 10.0000 mg | ORAL_TABLET | Freq: Every day | ORAL | 1 refills | Status: AC

## 2022-03-11 MED ORDER — LEVOCETIRIZINE DIHYDROCHLORIDE 5 MG PO TABS: 5.0000 mg | ORAL_TABLET | Freq: Every evening | ORAL | 1 refills | Status: AC

## 2022-03-11 MED ORDER — FLUTICASONE PROPIONATE 50 MCG/ACT NA SUSP: 1.0000 | Freq: Every day | NASAL | 1 refills | Status: DC

## 2022-03-11 NOTE — Discharge Instructions (Signed)
For your allergy symptoms, please continue Claritin, Xyzal and azelastine nasal spray.  Please add in Flonase steroid nasal spray, 1 spray twice daily.  Your rapid strep test today is negative, throat culture will be performed per protocol.  This typically takes 3 to 5 days and the result will be posted to your MyChart account.  If there is a positive result, you will be contacted by phone and antibiotics to be prescribed for you.  Please continue to monitor your symptoms and return for repeat evaluation if you find that they significantly worsen over the next several days.  Thank you for visiting urgent care today.

## 2022-03-11 NOTE — ED Provider Notes (Addendum)
[facility]    CSN: [id number] Arrival date & time: 03/11/22  1818    HISTORY   Chief Complaint  Patient presents with   Sore Throat   Nasal Congestion   Generalized Body Aches   Headache   Chills   HPI Karen Berg is a 36 y.o. female. Patient presents to urgent care today complaining of acute onset of sore throat, nasal congestion, clear nasal drainage, frontal and maxillary pressure in both sinuses, body aches, chills and headache since last night.  Patient denies abdominal pain, nausea, known sick contacts, fever, pain with swallowing, cough, shortness of breath, difficulty maintaining airway.  Patient has elevated temperature on arrival with a heart rate of 108.  Patient states she took some Motrin last night for her symptoms, has not taken anything today.  Patient reports a history of seasonal allergies, currently taking Claritin in the morning, Xyzal in the evening and azelastine twice daily.  Patient states she initially thought her symptoms were related to her allergies but had significant worsening of her symptoms and decided to come in to be evaluated.  The history is provided by the patient.  Past Medical History:  Diagnosis Date   Eczema    Family history of anesthesia complication    Mother had PONV   Fibroids    uterine fibroids   GERD (gastroesophageal reflux disease)    Migraine    Patient Active Problem List   Diagnosis Date Noted   Fibroids--multiple 09/24/2021   History of postpartum depression 09/24/2021   S/P primary low transverse C-section 06/09/2017   Eczema 01/20/2015   Past Surgical History:  Procedure Laterality Date   CESAREAN SECTION N/A 06/09/2017   Procedure: CESAREAN SECTION;  Surgeon: Janyth Pupa, DO;  Location: [place];  Service: Obstetrics;  Laterality: N/A;   CHOLECYSTECTOMY N/A 02/05/2014   Procedure: LAPAROSCOPIC CHOLECYSTECTOMY WITH INTRAOPERATIVE CHOLANGIOGRAM;  Surgeon: [name]. Georgette Dover, MD;  Location:  [place];  Service: General;  Laterality: N/A;   DILATION AND EVACUATION N/A 09/25/2021   Procedure: DILATATION AND EVACUATION;  Surgeon: Sanjuana Kava, MD;  Location: [place];  Service: Gynecology;  Laterality: N/A;   OPERATIVE ULTRASOUND N/A 09/25/2021   Procedure: OPERATIVE ULTRASOUND;  Surgeon: Sanjuana Kava, MD;  Location: [place];  Service: Gynecology;  Laterality: N/A;   WISDOM TOOTH EXTRACTION     OB History     Gravida  2   Para  1   Term  1   Preterm      AB      Living  1      SAB      IAB      Ectopic      Multiple  0   Live Births  1          Home Medications    Prior to Admission medications   Medication Sig Start Date End Date Taking? Authorizing Provider  cetirizine (ZYRTEC) 10 MG tablet Take 10 mg by mouth daily as needed for allergies.    [provider]  fluticasone (FLONASE) 50 MCG/ACT nasal spray Place 1-2 sprays into both nostrils daily as needed for allergies or rhinitis.    [provider]  triamcinolone ointment (KENALOG) 0.5 % Apply 1 application topically 2 (two) times daily as needed (skin irritation.).    [provider]   Family History Family History  Problem Relation Age of Onset   Diabetes Other    Cancer - Prostate Other    Cancer -  Lung Other    Social History Social History   Tobacco Use   Smoking status: Never   Smokeless tobacco: Never  Vaping Use   Vaping Use: Never used  Substance Use Topics   Alcohol use: No    Comment: rarely   Drug use: No   Allergies   No known allergies  Review of Systems Review of Systems Pertinent findings noted in history of present illness.   Physical Exam Triage Vital Signs ED Triage Vitals  Enc Vitals Group     BP 08/14/21 0827 (!) 147/82     Pulse Rate 08/14/21 0827 72     Resp 08/14/21 0827 18     Temp 08/14/21 0827 98.3 F (36.8 C)     Temp Source 08/14/21 0827 Oral     SpO2 08/14/21 0827 98 %     Weight --      Height --      Head Circumference --       Peak Flow --      Pain Score 08/14/21 0826 5     Pain Loc --      Pain Edu? --      Excl. in [place]? --   No data found.  Updated Vital Signs BP 131/89   Pulse (!) 108   Temp 99.7 F (37.6 C)   Resp 20   LMP 03/03/2022 (Exact Date)   SpO2 98%   Breastfeeding Unknown   Physical Exam Constitutional:      General: She is not in acute distress.    Appearance: She is well-developed. She is not ill-appearing or toxic-appearing.  HENT:     Head: Normocephalic and atraumatic.     Salivary Glands: Right salivary gland is diffusely enlarged and tender. Left salivary gland is diffusely enlarged and tender.     Right Ear: Hearing and external ear normal. No middle ear effusion. Tympanic membrane is bulging. Tympanic membrane is not injected or erythematous.     Left Ear: Hearing and external ear normal.  No middle ear effusion. Tympanic membrane is bulging. Tympanic membrane is not injected or erythematous.     Ears:     Comments: Bilateral EACs with mild erythema, bilateral TMs are bulging with clear fluid    Nose: Mucosal edema, congestion and rhinorrhea present. No nasal deformity, septal deviation, signs of injury or nasal tenderness. Rhinorrhea is clear.     Right Nostril: Occlusion present. No foreign body, epistaxis or septal hematoma.     Left Nostril: Occlusion present. No foreign body, epistaxis or septal hematoma.     Right Turbinates: Enlarged, swollen and pale.     Left Turbinates: Enlarged, swollen and pale.     Right Sinus: No maxillary sinus tenderness or frontal sinus tenderness.     Left Sinus: No maxillary sinus tenderness or frontal sinus tenderness.     Mouth/Throat:     Lips: Pink. No lesions.     Mouth: Mucous membranes are moist. No oral lesions or angioedema.     Dentition: No gingival swelling.     Tongue: No lesions.     Palate: No mass.     Pharynx: Uvula midline. No pharyngeal swelling, oropharyngeal exudate, posterior oropharyngeal erythema or uvula  swelling.     Tonsils: No tonsillar exudate. 1+ on the right. 1+ on the left.     Comments: Postnasal drip Eyes:     Extraocular Movements: Extraocular movements intact.     Conjunctiva/sclera: Conjunctivae normal.     Pupils:  Pupils are equal, round, and reactive to light.  Neck:     Thyroid: No thyroid mass, thyromegaly or thyroid tenderness.     Trachea: Tracheal tenderness present. No abnormal tracheal secretions or tracheal deviation.     Comments: Voice is muffled Cardiovascular:     Rate and Rhythm: Normal rate and regular rhythm.     Pulses: Normal pulses.     Heart sounds: Normal heart sounds, S1 normal and S2 normal. No murmur heard.   No friction rub. No gallop.  Pulmonary:     Effort: Pulmonary effort is normal. No accessory muscle usage, prolonged expiration, respiratory distress or retractions.     Breath sounds: No stridor, decreased air movement or transmitted upper airway sounds. No decreased breath sounds, wheezing, rhonchi or rales.  Abdominal:     General: Bowel sounds are normal.     Palpations: Abdomen is soft.     Tenderness: There is no abdominal tenderness. There is no right CVA tenderness, left CVA tenderness or rebound. Negative signs include Murphy's sign.     Hernia: No hernia is present.  Musculoskeletal:        General: No tenderness. Normal range of motion.     Cervical back: Full passive range of motion without pain, normal range of motion and neck supple.     Right lower leg: No edema.     Left lower leg: No edema.  Lymphadenopathy:     Cervical: Cervical adenopathy present.     Right cervical: Superficial cervical adenopathy present.     Left cervical: Superficial cervical adenopathy present.  Skin:    General: Skin is warm and dry.     Findings: No erythema, lesion or rash.  Neurological:     General: No focal deficit present.     Mental Status: She is alert and oriented to person, place, and time. Mental status is at baseline.  Psychiatric:         Mood and Affect: Mood normal.        Behavior: Behavior normal.        Thought Content: Thought content normal.        Judgment: Judgment normal.    Visual Acuity Right Eye Distance:   Left Eye Distance:   Bilateral Distance:    Right Eye Near:   Left Eye Near:    Bilateral Near:     UC Couse / Diagnostics / Procedures:    EKG  Radiology No results found.  Procedures Procedures (including critical care time)  UC Diagnoses / Final Clinical Impressions(s)   I have reviewed the triage vital signs and the nursing notes.  Pertinent labs & imaging results that were available during my care of the patient were reviewed by me and considered in my medical decision making (see chart for details).   Final diagnoses:  Sore throat  Seasonal allergic rhinitis, unspecified trigger   Physical exam is not concerning for strep throat.  Rapid strep test is negative.  Throat culture is pending, will treat with antibiotics as indicated.  Patient advised to continue her allergy medications and add Flonase.  ED Prescriptions     Medication Sig Dispense Auth. Provider   loratadine (CLARITIN) 10 MG tablet Take 1 tablet (10 mg total) by mouth daily. 90 tablet Lynden Oxford Scales, PA-C   azelastine (ASTELIN) 0.1 % nasal spray Place 2 sprays into both nostrils 2 (two) times daily. Use in each nostril as directed 30 mL Lynden Oxford Scales, PA-C   levocetirizine Harlow Ohms)  5 MG tablet Take 1 tablet (5 mg total) by mouth every evening. 90 tablet Lynden Oxford Scales, PA-C   fluticasone (FLONASE) 50 MCG/ACT nasal spray Place 1 spray into both nostrils daily. Begin by using 2 sprays in each nare daily for 3 to 5 days, then decrease to 1 spray in each nare daily. 16 mL Lynden Oxford Scales, PA-C      PDMP not reviewed this encounter.  Pending results:  Labs Reviewed  POCT RAPID STREP A (OFFICE) - Normal  CULTURE, GROUP A STREP [facility])    Medications Ordered in UC: Medications - No  data to display  Disposition Upon Discharge:  Condition: stable for discharge home Home: take medications as prescribed; routine discharge instructions as discussed; follow up as advised.  Patient presented with an acute illness with associated systemic symptoms and significant discomfort requiring urgent management. In my opinion, this is a condition that a prudent lay person (someone who possesses an average knowledge of health and medicine) may potentially expect to result in complications if not addressed urgently such as respiratory distress, impairment of bodily function or dysfunction of bodily organs.   Routine symptom specific, illness specific and/or disease specific instructions were discussed with the patient and/or caregiver at length.   As such, the patient has been evaluated and assessed, work-up was performed and treatment was provided in alignment with urgent care protocols and evidence based medicine.  Patient/parent/caregiver has been advised that the patient may require follow up for further testing and treatment if the symptoms continue in spite of treatment, as clinically indicated and appropriate.  If the patient was tested for COVID-19, Influenza and/or RSV, then the patient/parent/guardian was advised to isolate at home pending the results of his/her diagnostic coronavirus test and potentially longer if they're positive. I have also advised pt that if his/her COVID-19 test returns positive, it's recommended to self-isolate for at least 10 days after symptoms first appeared AND until fever-free for 24 hours without fever reducer AND other symptoms have improved or resolved. Discussed self-isolation recommendations as well as instructions for household member/close contacts as per the [facility] and [place] DHHS, and also gave patient the [place] packet with this information.  Patient/parent/caregiver has been advised to return to the [facility] or PCP in 3-5 days if no better; to PCP or the  Emergency Department if new signs and symptoms develop, or if the current signs or symptoms continue to change or worsen for further workup, evaluation and treatment as clinically indicated and appropriate  The patient will follow up with their current PCP if and as advised. If the patient does not currently have a PCP we will assist them in obtaining one.   The patient may need specialty follow up if the symptoms continue, in spite of conservative treatment and management, for further workup, evaluation, consultation and treatment as clinically indicated and appropriate.  Patient/parent/caregiver verbalized understanding and agreement of plan as discussed.  All questions were addressed during visit.  Please see discharge instructions below for further details of plan.  Discharge Instructions:   Discharge Instructions      For your allergy symptoms, please continue Claritin, Xyzal and azelastine nasal spray.  Please add in Flonase steroid nasal spray, 1 spray twice daily.  Your rapid strep test today is negative, throat culture will be performed per protocol.  This typically takes 3 to 5 days and the result will be posted to your MyChart account.  If there is a positive result, you will be contacted by  phone and antibiotics to be prescribed for you.  Please continue to monitor your symptoms and return for repeat evaluation if you find that they significantly worsen over the next several days.  Thank you for visiting urgent care today.      This office note has been dictated using Museum/gallery curator.  Unfortunately, and despite my best efforts, this method of dictation can sometimes lead to occasional typographical or grammatical errors.  I apologize in advance if this occurs.     Lynden Oxford Scales, PA-C 03/11/22 1858    Lynden Oxford Scales, PA-C 03/11/22 1859

## 2022-03-11 NOTE — ED Triage Notes (Signed)
Pt here with sore throat, congestion, body aches, chills and headaches since last night.

## 2022-03-16 ENCOUNTER — Ambulatory Visit (INDEPENDENT_AMBULATORY_CARE_PROVIDER_SITE_OTHER): Payer: No Typology Code available for payment source | Admitting: Gastroenterology

## 2022-03-16 ENCOUNTER — Encounter: Payer: Self-pay | Admitting: Gastroenterology

## 2022-03-16 VITALS — BP 122/92 | HR 90 | Ht 64.0 in | Wt 178.5 lb

## 2022-03-16 DIAGNOSIS — R197 Diarrhea, unspecified: Secondary | ICD-10-CM | POA: Diagnosis not present

## 2022-03-16 NOTE — Patient Instructions (Signed)
If you are age 38 or younger, your body mass index should be between 19-25. Your Body mass index is 30.64 kg/m. If this is out of the aformentioned range listed, please consider follow up with your Primary Care Provider.  ________________________________________________________  The  GI providers would like to encourage you to use [facility] to communicate with providers for non-urgent requests or questions.  Due to long hold times on the telephone, sending your provider a message by [facility] may be a faster and more efficient way to get a response.  Please allow 48 business hours for a response.  Please remember that this is for non-urgent requests.  _______________________________________________________  Please contact our office at [phone] if you have a 3rd episode of diarrhea.  Thank you for entrusting me with your care and choosing [facility].  Dr Ardis Hughs

## 2022-03-16 NOTE — Progress Notes (Signed)
HPI: This is a very pleasant 36 year old woman who was referred to me by Katherina Mires, MD  to evaluate acute diarrhea.    About 3 months ago she had a 2-day acute diarrheal illness.  This was self-limited.  She cannot point to any preceding sick contacts, travel or antibiotics.  About 6 weeks ago she had another acute diarrheal illness.  This was more prolonged.  It lasted 3 weeks.  She was having 6-8 loose nonbloody stools, some nocturnal stooling.  She was having lower abdominal discomforts that were burning around this time.  Again no sick contacts, no preceding antibiotics and no travel.  She is back to her usual except she has had 1 or 2 episodes of lower abdominal burning.  Her bowels are back to normal.  She has 1 solid bowel movement once daily.  With this second prolonged acute diarrheal illness she lost about 5 pounds.  She underwent quite a lot of testing, see at all below.  Old Data Reviewed:  Lab testing for 2023 through the [facility]: CBC was normal, complete metabolic profile was normal except for potassium 3.4, TSH was normal stool GI pathogen panel was negative, Cryptosporidium was negative from stool, norovirus and rotavirus were also negative from stool, Salmonella and Yersinia were also negative from stool, C. difficile toxin AMB were negative.  I was able to view an office note from I believe her primary care physician through the [facility] system, care everywhere, office appointment 01/2022: Follow-up for diarrhea and abdominal cramping.    Review of systems: Pertinent positive and negative review of systems were noted in the above HPI section. All other review negative.   Past Medical History:  Diagnosis Date   Eczema    Family history of anesthesia complication    Mother had PONV   Fibroids    uterine fibroids   GERD (gastroesophageal reflux disease)    Migraine     Past Surgical History:  Procedure Laterality Date   CESAREAN SECTION N/A  06/09/2017   Procedure: CESAREAN SECTION;  Surgeon: Janyth Pupa, DO;  Location: [place];  Service: Obstetrics;  Laterality: N/A;   CHOLECYSTECTOMY N/A 02/05/2014   Procedure: LAPAROSCOPIC CHOLECYSTECTOMY WITH INTRAOPERATIVE CHOLANGIOGRAM;  Surgeon: [name]. Georgette Dover, MD;  Location: [place];  Service: General;  Laterality: N/A;   DILATION AND EVACUATION N/A 09/25/2021   Procedure: DILATATION AND EVACUATION;  Surgeon: Sanjuana Kava, MD;  Location: [place];  Service: Gynecology;  Laterality: N/A;   OPERATIVE ULTRASOUND N/A 09/25/2021   Procedure: OPERATIVE ULTRASOUND;  Surgeon: Sanjuana Kava, MD;  Location: [place];  Service: Gynecology;  Laterality: N/A;   WISDOM TOOTH EXTRACTION      Current Outpatient Medications  Medication Instructions   azelastine (ASTELIN) 0.1 % nasal spray 2 sprays, Each Nare, 2 times daily, Use in each nostril as directed   cyclobenzaprine (FLEXERIL) 5-10 mg, Oral, 3 times daily PRN   fluticasone (FLONASE) 50 MCG/ACT nasal spray 1 spray, Each Nare, Daily, Begin by using 2 sprays in each nare daily for 3 to 5 days, then decrease to 1 spray in each nare daily.   levocetirizine (XYZAL) 5 mg, Oral, Every evening   loratadine (CLARITIN) 10 mg, Oral, Daily   naratriptan (AMERGE) 1.25 mg, Oral, 2 times daily PRN   Prenatal Vit-Fe Fumarate-FA (PRENATAL PO) 2 tablets, Oral, Daily, 2 gummies a day    triamcinolone ointment (KENALOG) 0.5 % 1 application., Topical, 2 times daily PRN    Allergies as of  03/16/2022 - Review Complete 03/16/2022  Allergen Reaction Noted   No known allergies      Family History  Problem Relation Age of Onset   Lung cancer Maternal Grandfather    Diabetes Paternal Grandmother    Prostate cancer Paternal Grandfather    Diabetes Other    Cancer - Prostate Other    Cancer - Lung Other    Colon cancer Neg Hx    Esophageal cancer Neg Hx     Social History   Socioeconomic History   Marital status: Single    Spouse name: Not on file   Number  of children: Not on file   Years of education: Not on file   Highest education level: Not on file  Occupational History   Not on file  Tobacco Use   Smoking status: Never   Smokeless tobacco: Never  Vaping Use   Vaping Use: Never used  Substance and Sexual Activity   Alcohol use: No    Comment: rarely   Drug use: Never   Sexual activity: Yes    Birth control/protection: None  Other Topics Concern   Not on file  Social History Narrative   Not on file   Social Determinants of Health   Financial Resource Strain: Not on file  Food Insecurity: Not on file  Transportation Needs: Not on file  Physical Activity: Not on file  Stress: Not on file  Social Connections: Not on file  Intimate Partner Violence: Not on file     Physical Exam: Ht '5\' 4"'$  (1.626 m)   Wt 178 lb 8 oz (81 kg)   LMP 03/03/2022 (Exact Date)   BMI 30.64 kg/m  Constitutional: generally well-appearing Psychiatric: alert and oriented x3 Eyes: extraocular movements intact Mouth: oral pharynx moist, no lesions Neck: supple no lymphadenopathy Cardiovascular: heart regular rate and rhythm Lungs: clear to auscultation bilaterally Abdomen: soft, nontender, nondistended, no obvious ascites, no peritoneal signs, normal bowel sounds Extremities: no lower extremity edema bilaterally Skin: no lesions on visible extremities   Assessment and plan: 36 y.o. female with 2 episodes of acute diarrheal illness  Possibly this was infection related.  Her first episode was quite minor and her second episode lasted about 3 weeks.  She is completely back to normal.  We discussed 2 options.  One would be to proceed with further testing now which would be a colonoscopy with looking the terminal ileum.  The second option is to simply see if she has a third episode sometime in the near future.  If she does have probably repeat stool testing and possibly some blood work as well and then proceed at that time to colonoscopy with look in  the terminal ileum.  She prefers the second, wait and see approach.  Please see the "Patient Instructions" section for addition details about the plan.   Owens Loffler, MD [facility] 03/16/2022, 10:17 AM  Cc: Katherina Mires, MD  Total time on date of encounter was 45  minutes (this included time spent preparing to see the patient reviewing records; obtaining and/or reviewing separately obtained history; performing a medically appropriate exam and/or evaluation; counseling and educating the patient and family if present; ordering medications, tests or procedures if applicable; and documenting clinical information in the health record).

## 2022-03-29 ENCOUNTER — Ambulatory Visit: Payer: 59 | Admitting: Internal Medicine

## 2022-08-24 LAB — OB RESULTS CONSOLE HGB/HCT, BLOOD
HCT: 34 (ref 29–41)
Hemoglobin: 11.2

## 2022-08-24 LAB — OB RESULTS CONSOLE RUBELLA ANTIBODY, IGM: Rubella: NON-IMMUNE/NOT IMMUNE

## 2022-08-24 LAB — OB RESULTS CONSOLE HEPATITIS B SURFACE ANTIGEN: Hepatitis B Surface Ag: NEGATIVE

## 2022-08-24 LAB — OB RESULTS CONSOLE HIV ANTIBODY (ROUTINE TESTING): HIV: NONREACTIVE

## 2022-08-24 LAB — OB RESULTS CONSOLE PLATELET COUNT: Platelets: 307

## 2022-08-27 ENCOUNTER — Other Ambulatory Visit: Payer: Self-pay

## 2022-08-27 ENCOUNTER — Other Ambulatory Visit: Payer: Self-pay | Admitting: Obstetrics and Gynecology

## 2022-08-27 DIAGNOSIS — O09521 Supervision of elderly multigravida, first trimester: Secondary | ICD-10-CM

## 2022-08-31 LAB — OB RESULTS CONSOLE GC/CHLAMYDIA: Chlamydia: NEGATIVE

## 2022-10-15 ENCOUNTER — Encounter: Payer: Self-pay | Admitting: *Deleted

## 2022-10-20 ENCOUNTER — Encounter: Payer: Self-pay | Admitting: *Deleted

## 2022-10-20 ENCOUNTER — Other Ambulatory Visit: Payer: Self-pay | Admitting: *Deleted

## 2022-10-20 ENCOUNTER — Ambulatory Visit: Payer: No Typology Code available for payment source | Admitting: *Deleted

## 2022-10-20 ENCOUNTER — Ambulatory Visit: Payer: No Typology Code available for payment source | Attending: Obstetrics and Gynecology

## 2022-10-20 ENCOUNTER — Other Ambulatory Visit: Payer: Self-pay | Admitting: Obstetrics and Gynecology

## 2022-10-20 DIAGNOSIS — O34219 Maternal care for unspecified type scar from previous cesarean delivery: Secondary | ICD-10-CM

## 2022-10-20 DIAGNOSIS — O3412 Maternal care for benign tumor of corpus uteri, second trimester: Secondary | ICD-10-CM | POA: Diagnosis not present

## 2022-10-20 DIAGNOSIS — O09522 Supervision of elderly multigravida, second trimester: Secondary | ICD-10-CM | POA: Diagnosis not present

## 2022-10-20 DIAGNOSIS — D259 Leiomyoma of uterus, unspecified: Secondary | ICD-10-CM

## 2022-10-20 DIAGNOSIS — Z363 Encounter for antenatal screening for malformations: Secondary | ICD-10-CM

## 2022-10-20 DIAGNOSIS — Z3A19 19 weeks gestation of pregnancy: Secondary | ICD-10-CM

## 2022-10-20 DIAGNOSIS — O09521 Supervision of elderly multigravida, first trimester: Secondary | ICD-10-CM | POA: Insufficient documentation

## 2022-11-17 ENCOUNTER — Ambulatory Visit: Payer: No Typology Code available for payment source | Attending: Obstetrics and Gynecology | Admitting: *Deleted

## 2022-11-17 ENCOUNTER — Ambulatory Visit: Payer: No Typology Code available for payment source

## 2022-11-17 ENCOUNTER — Ambulatory Visit (HOSPITAL_BASED_OUTPATIENT_CLINIC_OR_DEPARTMENT_OTHER): Payer: No Typology Code available for payment source

## 2022-11-17 VITALS — BP 116/61 | HR 92

## 2022-11-17 DIAGNOSIS — Z362 Encounter for other antenatal screening follow-up: Secondary | ICD-10-CM

## 2022-11-17 DIAGNOSIS — O09522 Supervision of elderly multigravida, second trimester: Secondary | ICD-10-CM | POA: Diagnosis not present

## 2022-11-17 DIAGNOSIS — O3412 Maternal care for benign tumor of corpus uteri, second trimester: Secondary | ICD-10-CM | POA: Insufficient documentation

## 2022-11-17 DIAGNOSIS — Z3A23 23 weeks gestation of pregnancy: Secondary | ICD-10-CM | POA: Insufficient documentation

## 2022-11-17 DIAGNOSIS — O34219 Maternal care for unspecified type scar from previous cesarean delivery: Secondary | ICD-10-CM | POA: Insufficient documentation

## 2022-11-17 DIAGNOSIS — D259 Leiomyoma of uterus, unspecified: Secondary | ICD-10-CM

## 2022-11-18 ENCOUNTER — Other Ambulatory Visit: Payer: Self-pay | Admitting: *Deleted

## 2022-11-18 DIAGNOSIS — D259 Leiomyoma of uterus, unspecified: Secondary | ICD-10-CM

## 2022-12-21 LAB — OB RESULTS CONSOLE HGB/HCT, BLOOD
HCT: 28 — AB (ref 29–41)
Hemoglobin: 9

## 2022-12-21 LAB — OB RESULTS CONSOLE PLATELET COUNT: Platelets: 266

## 2022-12-29 ENCOUNTER — Ambulatory Visit: Payer: No Typology Code available for payment source | Attending: Obstetrics

## 2022-12-29 ENCOUNTER — Ambulatory Visit: Payer: No Typology Code available for payment source | Admitting: *Deleted

## 2022-12-29 VITALS — BP 112/69 | HR 95

## 2022-12-29 DIAGNOSIS — D259 Leiomyoma of uterus, unspecified: Secondary | ICD-10-CM | POA: Insufficient documentation

## 2022-12-29 DIAGNOSIS — O403XX0 Polyhydramnios, third trimester, not applicable or unspecified: Secondary | ICD-10-CM

## 2022-12-29 DIAGNOSIS — O34219 Maternal care for unspecified type scar from previous cesarean delivery: Secondary | ICD-10-CM

## 2022-12-29 DIAGNOSIS — O09523 Supervision of elderly multigravida, third trimester: Secondary | ICD-10-CM | POA: Diagnosis not present

## 2022-12-29 DIAGNOSIS — Z3A29 29 weeks gestation of pregnancy: Secondary | ICD-10-CM

## 2022-12-29 DIAGNOSIS — O3410 Maternal care for benign tumor of corpus uteri, unspecified trimester: Secondary | ICD-10-CM | POA: Insufficient documentation

## 2022-12-29 DIAGNOSIS — O3413 Maternal care for benign tumor of corpus uteri, third trimester: Secondary | ICD-10-CM | POA: Diagnosis not present

## 2022-12-30 ENCOUNTER — Other Ambulatory Visit: Payer: Self-pay | Admitting: *Deleted

## 2022-12-30 DIAGNOSIS — D259 Leiomyoma of uterus, unspecified: Secondary | ICD-10-CM

## 2022-12-30 DIAGNOSIS — O409XX0 Polyhydramnios, unspecified trimester, not applicable or unspecified: Secondary | ICD-10-CM

## 2022-12-30 DIAGNOSIS — O09523 Supervision of elderly multigravida, third trimester: Secondary | ICD-10-CM

## 2023-01-19 ENCOUNTER — Ambulatory Visit: Payer: No Typology Code available for payment source | Attending: Obstetrics | Admitting: *Deleted

## 2023-01-19 ENCOUNTER — Ambulatory Visit: Payer: No Typology Code available for payment source | Admitting: *Deleted

## 2023-01-19 VITALS — BP 126/66 | HR 92

## 2023-01-19 DIAGNOSIS — O09523 Supervision of elderly multigravida, third trimester: Secondary | ICD-10-CM | POA: Insufficient documentation

## 2023-01-19 DIAGNOSIS — O403XX0 Polyhydramnios, third trimester, not applicable or unspecified: Secondary | ICD-10-CM | POA: Insufficient documentation

## 2023-01-19 DIAGNOSIS — O409XX0 Polyhydramnios, unspecified trimester, not applicable or unspecified: Secondary | ICD-10-CM | POA: Diagnosis not present

## 2023-01-19 DIAGNOSIS — O99213 Obesity complicating pregnancy, third trimester: Secondary | ICD-10-CM | POA: Insufficient documentation

## 2023-01-19 DIAGNOSIS — D259 Leiomyoma of uterus, unspecified: Secondary | ICD-10-CM | POA: Diagnosis not present

## 2023-01-19 DIAGNOSIS — O3413 Maternal care for benign tumor of corpus uteri, third trimester: Secondary | ICD-10-CM | POA: Insufficient documentation

## 2023-01-19 DIAGNOSIS — Z3A32 32 weeks gestation of pregnancy: Secondary | ICD-10-CM | POA: Diagnosis not present

## 2023-01-19 NOTE — Procedures (Signed)
Karen Berg 07-31-1986 [redacted]w[redacted]d  Fetus A Non-Stress Test Interpretation for 01/19/23  Indication: Polyhydramnios, Advanced Maternal Age >40 years, and ut fibroids  Fetal Heart Rate A Mode: External Baseline Rate (A): 145 bpm Variability: Moderate Accelerations: 15 x 15 Decelerations: None Multiple birth?: No  Uterine Activity Mode: Toco Contraction Frequency (min): none Resting Tone Palpated: Relaxed  Interpretation (Fetal Testing) Nonstress Test Interpretation: Reactive Overall Impression: Reassuring for gestational age Comments: none

## 2023-01-26 ENCOUNTER — Ambulatory Visit: Payer: No Typology Code available for payment source | Admitting: *Deleted

## 2023-01-26 ENCOUNTER — Ambulatory Visit: Payer: No Typology Code available for payment source | Attending: Obstetrics

## 2023-01-26 VITALS — BP 117/65 | HR 89

## 2023-01-26 DIAGNOSIS — O3413 Maternal care for benign tumor of corpus uteri, third trimester: Secondary | ICD-10-CM | POA: Diagnosis not present

## 2023-01-26 DIAGNOSIS — O09523 Supervision of elderly multigravida, third trimester: Secondary | ICD-10-CM

## 2023-01-26 DIAGNOSIS — D259 Leiomyoma of uterus, unspecified: Secondary | ICD-10-CM

## 2023-01-26 DIAGNOSIS — Z3A33 33 weeks gestation of pregnancy: Secondary | ICD-10-CM

## 2023-01-26 DIAGNOSIS — O403XX0 Polyhydramnios, third trimester, not applicable or unspecified: Secondary | ICD-10-CM

## 2023-01-26 DIAGNOSIS — O409XX0 Polyhydramnios, unspecified trimester, not applicable or unspecified: Secondary | ICD-10-CM | POA: Diagnosis present

## 2023-01-26 DIAGNOSIS — O34219 Maternal care for unspecified type scar from previous cesarean delivery: Secondary | ICD-10-CM

## 2023-02-02 ENCOUNTER — Ambulatory Visit: Payer: No Typology Code available for payment source | Admitting: *Deleted

## 2023-02-02 ENCOUNTER — Ambulatory Visit: Payer: No Typology Code available for payment source | Attending: Obstetrics

## 2023-02-02 VITALS — BP 116/74 | HR 90

## 2023-02-02 DIAGNOSIS — D259 Leiomyoma of uterus, unspecified: Secondary | ICD-10-CM

## 2023-02-02 DIAGNOSIS — O409XX0 Polyhydramnios, unspecified trimester, not applicable or unspecified: Secondary | ICD-10-CM

## 2023-02-02 DIAGNOSIS — O403XX0 Polyhydramnios, third trimester, not applicable or unspecified: Secondary | ICD-10-CM

## 2023-02-02 DIAGNOSIS — O34219 Maternal care for unspecified type scar from previous cesarean delivery: Secondary | ICD-10-CM

## 2023-02-02 DIAGNOSIS — O09523 Supervision of elderly multigravida, third trimester: Secondary | ICD-10-CM

## 2023-02-02 DIAGNOSIS — O3413 Maternal care for benign tumor of corpus uteri, third trimester: Secondary | ICD-10-CM | POA: Diagnosis present

## 2023-02-02 DIAGNOSIS — Z3A34 34 weeks gestation of pregnancy: Secondary | ICD-10-CM

## 2023-02-02 NOTE — Procedures (Signed)
Karen Berg 11-03-1985 [redacted]w[redacted]d  Fetus A Non-Stress Test Interpretation for 02/02/23  Indication: Unsatisfactory BPP  Fetal Heart Rate A Mode: External Baseline Rate (A): 135 bpm Variability: Moderate Accelerations: 15 x 15 Decelerations: None Multiple birth?: No  Uterine Activity Mode: Toco Contraction Frequency (min): 3-10 Contraction Duration (sec): 80-120 Contraction Quality: Mild (not felt by pt) Resting Tone Palpated: Relaxed Resting Time: Adequate  Interpretation (Fetal Testing) Nonstress Test Interpretation: Reactive Overall Impression: Reassuring for gestational age Comments: Tracing reviewed by Dr. Grace Bushy

## 2023-02-03 ENCOUNTER — Other Ambulatory Visit: Payer: Self-pay | Admitting: Obstetrics & Gynecology

## 2023-02-04 ENCOUNTER — Encounter: Payer: Self-pay | Admitting: *Deleted

## 2023-02-09 ENCOUNTER — Ambulatory Visit: Payer: No Typology Code available for payment source | Attending: Obstetrics

## 2023-02-09 ENCOUNTER — Ambulatory Visit: Payer: No Typology Code available for payment source | Admitting: *Deleted

## 2023-02-09 VITALS — BP 125/80 | HR 90

## 2023-02-09 DIAGNOSIS — O09523 Supervision of elderly multigravida, third trimester: Secondary | ICD-10-CM

## 2023-02-09 DIAGNOSIS — O34219 Maternal care for unspecified type scar from previous cesarean delivery: Secondary | ICD-10-CM

## 2023-02-09 DIAGNOSIS — O403XX0 Polyhydramnios, third trimester, not applicable or unspecified: Secondary | ICD-10-CM | POA: Diagnosis not present

## 2023-02-09 DIAGNOSIS — O409XX0 Polyhydramnios, unspecified trimester, not applicable or unspecified: Secondary | ICD-10-CM | POA: Diagnosis present

## 2023-02-09 DIAGNOSIS — O3413 Maternal care for benign tumor of corpus uteri, third trimester: Secondary | ICD-10-CM

## 2023-02-09 DIAGNOSIS — Z3A35 35 weeks gestation of pregnancy: Secondary | ICD-10-CM

## 2023-02-09 DIAGNOSIS — D259 Leiomyoma of uterus, unspecified: Secondary | ICD-10-CM

## 2023-02-15 LAB — OB RESULTS CONSOLE GBS: GBS: POSITIVE

## 2023-02-16 ENCOUNTER — Ambulatory Visit: Payer: No Typology Code available for payment source | Admitting: *Deleted

## 2023-02-16 ENCOUNTER — Ambulatory Visit: Payer: No Typology Code available for payment source | Attending: Obstetrics

## 2023-02-16 VITALS — BP 112/72 | HR 97

## 2023-02-16 DIAGNOSIS — O09523 Supervision of elderly multigravida, third trimester: Secondary | ICD-10-CM | POA: Diagnosis not present

## 2023-02-16 DIAGNOSIS — O409XX0 Polyhydramnios, unspecified trimester, not applicable or unspecified: Secondary | ICD-10-CM | POA: Insufficient documentation

## 2023-02-16 DIAGNOSIS — O3413 Maternal care for benign tumor of corpus uteri, third trimester: Secondary | ICD-10-CM

## 2023-02-16 DIAGNOSIS — O403XX0 Polyhydramnios, third trimester, not applicable or unspecified: Secondary | ICD-10-CM

## 2023-02-16 DIAGNOSIS — Z3A36 36 weeks gestation of pregnancy: Secondary | ICD-10-CM

## 2023-02-16 DIAGNOSIS — D259 Leiomyoma of uterus, unspecified: Secondary | ICD-10-CM | POA: Diagnosis present

## 2023-02-16 DIAGNOSIS — O34219 Maternal care for unspecified type scar from previous cesarean delivery: Secondary | ICD-10-CM

## 2023-02-23 ENCOUNTER — Ambulatory Visit: Payer: No Typology Code available for payment source | Admitting: *Deleted

## 2023-02-23 ENCOUNTER — Ambulatory Visit: Payer: No Typology Code available for payment source | Attending: Obstetrics

## 2023-02-23 VITALS — BP 124/71 | HR 83

## 2023-02-23 DIAGNOSIS — O34219 Maternal care for unspecified type scar from previous cesarean delivery: Secondary | ICD-10-CM

## 2023-02-23 DIAGNOSIS — O403XX0 Polyhydramnios, third trimester, not applicable or unspecified: Secondary | ICD-10-CM

## 2023-02-23 DIAGNOSIS — D259 Leiomyoma of uterus, unspecified: Secondary | ICD-10-CM | POA: Diagnosis not present

## 2023-02-23 DIAGNOSIS — O3413 Maternal care for benign tumor of corpus uteri, third trimester: Secondary | ICD-10-CM | POA: Diagnosis not present

## 2023-02-23 DIAGNOSIS — O409XX0 Polyhydramnios, unspecified trimester, not applicable or unspecified: Secondary | ICD-10-CM

## 2023-02-23 DIAGNOSIS — O09523 Supervision of elderly multigravida, third trimester: Secondary | ICD-10-CM | POA: Diagnosis not present

## 2023-02-23 DIAGNOSIS — Z3A37 37 weeks gestation of pregnancy: Secondary | ICD-10-CM

## 2023-02-24 ENCOUNTER — Other Ambulatory Visit: Payer: Self-pay | Admitting: Obstetrics

## 2023-02-24 DIAGNOSIS — D259 Leiomyoma of uterus, unspecified: Secondary | ICD-10-CM

## 2023-02-24 DIAGNOSIS — O409XX0 Polyhydramnios, unspecified trimester, not applicable or unspecified: Secondary | ICD-10-CM

## 2023-02-24 DIAGNOSIS — O09523 Supervision of elderly multigravida, third trimester: Secondary | ICD-10-CM

## 2023-02-28 ENCOUNTER — Encounter (HOSPITAL_COMMUNITY): Payer: Self-pay | Admitting: Obstetrics and Gynecology

## 2023-02-28 ENCOUNTER — Other Ambulatory Visit: Payer: Self-pay

## 2023-02-28 ENCOUNTER — Inpatient Hospital Stay (HOSPITAL_COMMUNITY): Payer: No Typology Code available for payment source | Admitting: Anesthesiology

## 2023-02-28 ENCOUNTER — Encounter (HOSPITAL_COMMUNITY)
Admission: AD | Disposition: A | Discharge status: Home / Self Care | Payer: Self-pay | Source: Home / Self Care | Attending: Obstetrics and Gynecology

## 2023-02-28 ENCOUNTER — Inpatient Hospital Stay (HOSPITAL_COMMUNITY)
Admission: AD | Admit: 2023-02-28 | Discharge: 2023-03-03 | DRG: 788 | Disposition: A | Discharge status: Home / Self Care | Payer: No Typology Code available for payment source | Attending: Obstetrics and Gynecology | Admitting: Obstetrics and Gynecology

## 2023-02-28 DIAGNOSIS — Z2839 Other underimmunization status: Secondary | ICD-10-CM

## 2023-02-28 DIAGNOSIS — O99820 Streptococcus B carrier state complicating pregnancy: Secondary | ICD-10-CM

## 2023-02-28 DIAGNOSIS — O34211 Maternal care for low transverse scar from previous cesarean delivery: Principal | ICD-10-CM | POA: Diagnosis present

## 2023-02-28 DIAGNOSIS — O34593 Maternal care for other abnormalities of gravid uterus, third trimester: Secondary | ICD-10-CM | POA: Diagnosis not present

## 2023-02-28 DIAGNOSIS — O99824 Streptococcus B carrier state complicating childbirth: Secondary | ICD-10-CM | POA: Diagnosis present

## 2023-02-28 DIAGNOSIS — D259 Leiomyoma of uterus, unspecified: Secondary | ICD-10-CM | POA: Diagnosis present

## 2023-02-28 DIAGNOSIS — Z3A37 37 weeks gestation of pregnancy: Secondary | ICD-10-CM

## 2023-02-28 DIAGNOSIS — O26893 Other specified pregnancy related conditions, third trimester: Secondary | ICD-10-CM | POA: Diagnosis present

## 2023-02-28 DIAGNOSIS — O3413 Maternal care for benign tumor of corpus uteri, third trimester: Secondary | ICD-10-CM | POA: Diagnosis present

## 2023-02-28 DIAGNOSIS — O09899 Supervision of other high risk pregnancies, unspecified trimester: Secondary | ICD-10-CM

## 2023-02-28 DIAGNOSIS — O99214 Obesity complicating childbirth: Secondary | ICD-10-CM | POA: Diagnosis present

## 2023-02-28 DIAGNOSIS — Z8669 Personal history of other diseases of the nervous system and sense organs: Secondary | ICD-10-CM | POA: Insufficient documentation

## 2023-02-28 DIAGNOSIS — O4292 Full-term premature rupture of membranes, unspecified as to length of time between rupture and onset of labor: Secondary | ICD-10-CM | POA: Diagnosis present

## 2023-02-28 DIAGNOSIS — O09523 Supervision of elderly multigravida, third trimester: Secondary | ICD-10-CM | POA: Diagnosis present

## 2023-02-28 DIAGNOSIS — Z23 Encounter for immunization: Secondary | ICD-10-CM | POA: Diagnosis not present

## 2023-02-28 DIAGNOSIS — Z98891 History of uterine scar from previous surgery: Principal | ICD-10-CM

## 2023-02-28 DIAGNOSIS — O4290 Premature rupture of membranes, unspecified as to length of time between rupture and onset of labor, unspecified weeks of gestation: Secondary | ICD-10-CM | POA: Diagnosis present

## 2023-02-28 DIAGNOSIS — O4202 Full-term premature rupture of membranes, onset of labor within 24 hours of rupture: Secondary | ICD-10-CM

## 2023-02-28 LAB — COMPREHENSIVE METABOLIC PANEL
ALT: 37 U/L (ref 0–44)
AST: 35 U/L (ref 15–41)
Albumin: 3.4 g/dL — ABNORMAL LOW (ref 3.5–5.0)
Alkaline Phosphatase: 72 U/L (ref 38–126)
Anion gap: 10 (ref 5–15)
BUN: 5 mg/dL — ABNORMAL LOW (ref 6–20)
CO2: 21 mmol/L — ABNORMAL LOW (ref 22–32)
Calcium: 9.2 mg/dL (ref 8.9–10.3)
Chloride: 103 mmol/L (ref 98–111)
Creatinine, Ser: 0.74 mg/dL (ref 0.44–1.00)
GFR, Estimated: >60 mL/min (ref >60)
Glucose, Bld: 85 mg/dL (ref 70–99)
Potassium: 3.7 mmol/L (ref 3.5–5.1)
Sodium: 134 mmol/L — ABNORMAL LOW (ref 135–145)
Total Bilirubin: 0.6 mg/dL (ref 0.3–1.2)
Total Protein: 7.7 g/dL (ref 6.5–8.1)

## 2023-02-28 LAB — PROTEIN / CREATININE RATIO, URINE
Creatinine, Urine: 136 mg/dL
Protein Creatinine Ratio: 0.13 mg/mg{Cre} (ref 0.00–0.15)
Total Protein, Urine: 17 mg/dL

## 2023-02-28 LAB — CBC
HCT: 37.2 % (ref 36.0–46.0)
Hemoglobin: 12.4 g/dL (ref 12.0–15.0)
MCH: 30.8 pg (ref 26.0–34.0)
MCHC: 33.3 g/dL (ref 30.0–36.0)
MCV: 92.5 fL (ref 80.0–100.0)
Platelets: 217 10*3/uL (ref 150–400)
RBC: 4.02 MIL/uL (ref 3.87–5.11)
RDW: 16.1 % — ABNORMAL HIGH (ref 11.5–15.5)
WBC: 4.4 10*3/uL (ref 4.0–10.5)
nRBC: 0 % (ref 0.0–0.2)

## 2023-02-28 LAB — RPR: RPR Ser Ql: NONREACTIVE

## 2023-02-28 LAB — PREPARE RBC (CROSSMATCH)

## 2023-02-28 LAB — TYPE AND SCREEN: Unit division: 0

## 2023-02-28 LAB — LACTATE DEHYDROGENASE: LDH: 161 U/L (ref 98–192)

## 2023-02-28 LAB — POCT FERN TEST: POCT Fern Test: POSITIVE

## 2023-02-28 SURGERY — Surgical Case
Anesthesia: Spinal | Site: Abdomen

## 2023-02-28 MED ORDER — METHYLERGONOVINE MALEATE 0.2 MG/ML IJ SOLN
INTRAMUSCULAR | Status: DC | PRN
  Administered 2023-02-28: .2 mg via INTRAMUSCULAR

## 2023-02-28 MED ORDER — PHENYLEPHRINE HCL (PRESSORS) 10 MG/ML IV SOLN
INTRAVENOUS | Status: DC | PRN
  Administered 2023-02-28: 160 ug via INTRAVENOUS

## 2023-02-28 MED ORDER — LEVOCETIRIZINE DIHYDROCHLORIDE 5 MG PO TABS: 5.0000 mg | ORAL_TABLET | Freq: Every evening | ORAL | Status: DC

## 2023-02-28 MED ORDER — OXYTOCIN-SODIUM CHLORIDE 30-0.9 UT/500ML-% IV SOLN
INTRAVENOUS | Status: AC
  Filled 2023-02-28: qty 500

## 2023-02-28 MED ORDER — SOD CITRATE-CITRIC ACID 500-334 MG/5ML PO SOLN
30.0000 mL | ORAL | Status: AC
  Administered 2023-02-28: 30 mL via ORAL
  Filled 2023-02-28: qty 30

## 2023-02-28 MED ORDER — SODIUM CHLORIDE 0.9 % IR SOLN
Status: DC | PRN
  Administered 2023-02-28: 1000 mL

## 2023-02-28 MED ORDER — DIPHENHYDRAMINE HCL 25 MG PO CAPS
25.0000 mg | ORAL_CAPSULE | ORAL | Status: DC | PRN
  Administered 2023-03-01: 25 mg via ORAL
  Filled 2023-02-28: qty 1

## 2023-02-28 MED ORDER — PRENATAL MULTIVITAMIN CH
1.0000 | ORAL_TABLET | Freq: Every day | ORAL | Status: DC
  Administered 2023-03-01 – 2023-03-03 (×3): 1 via ORAL
  Filled 2023-02-28 (×3): qty 1

## 2023-02-28 MED ORDER — ONDANSETRON HCL 4 MG/2ML IJ SOLN
INTRAMUSCULAR | Status: DC | PRN
  Administered 2023-02-28: 4 mg via INTRAVENOUS

## 2023-02-28 MED ORDER — SOD CITRATE-CITRIC ACID 500-334 MG/5ML PO SOLN
30.0000 mL | Freq: Once | ORAL | Status: DC
  Filled 2023-02-28: qty 30

## 2023-02-28 MED ORDER — LACTATED RINGERS IV BOLUS
500.0000 mL | Freq: Once | INTRAVENOUS | Status: AC
  Administered 2023-02-28: 500 mL via INTRAVENOUS

## 2023-02-28 MED ORDER — SOD CITRATE-CITRIC ACID 500-334 MG/5ML PO SOLN: 30.0000 mL | ORAL | Status: DC

## 2023-02-28 MED ORDER — SCOPOLAMINE 1 MG/3DAYS TD PT72: 1.0000 | MEDICATED_PATCH | TRANSDERMAL | Status: DC

## 2023-02-28 MED ORDER — TRANEXAMIC ACID-NACL 1000-0.7 MG/100ML-% IV SOLN
INTRAVENOUS | Status: AC
  Filled 2023-02-28: qty 100

## 2023-02-28 MED ORDER — EPHEDRINE SULFATE (PRESSORS) 50 MG/ML IJ SOLN
INTRAMUSCULAR | Status: DC | PRN
  Administered 2023-02-28: 5 mg via INTRAVENOUS

## 2023-02-28 MED ORDER — MENTHOL 3 MG MT LOZG: 1.0000 | LOZENGE | OROMUCOSAL | Status: DC | PRN

## 2023-02-28 MED ORDER — ACETAMINOPHEN 10 MG/ML IV SOLN
INTRAVENOUS | Status: DC | PRN
  Administered 2023-02-28: 1000 mg via INTRAVENOUS

## 2023-02-28 MED ORDER — OXYTOCIN-SODIUM CHLORIDE 30-0.9 UT/500ML-% IV SOLN
INTRAVENOUS | Status: DC | PRN
  Administered 2023-02-28: 500 mL via INTRAVENOUS

## 2023-02-28 MED ORDER — ACETAMINOPHEN 500 MG PO TABS
1000.0000 mg | ORAL_TABLET | Freq: Four times a day (QID) | ORAL | Status: DC
  Administered 2023-02-28 – 2023-03-03 (×10): 1000 mg via ORAL
  Filled 2023-02-28 (×11): qty 2

## 2023-02-28 MED ORDER — SODIUM CHLORIDE 0.9 % IV SOLN
500.0000 mg | INTRAVENOUS | Status: AC
  Administered 2023-02-28: 500 mg via INTRAVENOUS
  Filled 2023-02-28: qty 5

## 2023-02-28 MED ORDER — LORATADINE 10 MG PO TABS
10.0000 mg | ORAL_TABLET | Freq: Every day | ORAL | Status: DC
  Administered 2023-02-28 – 2023-03-03 (×4): 10 mg via ORAL
  Filled 2023-02-28 (×5): qty 1

## 2023-02-28 MED ORDER — SIMETHICONE 80 MG PO CHEW
80.0000 mg | CHEWABLE_TABLET | Freq: Three times a day (TID) | ORAL | Status: DC
  Administered 2023-03-01 – 2023-03-03 (×7): 80 mg via ORAL
  Filled 2023-02-28 (×7): qty 1

## 2023-02-28 MED ORDER — PHENYLEPHRINE HCL-NACL 20-0.9 MG/250ML-% IV SOLN
INTRAVENOUS | Status: DC | PRN
  Administered 2023-02-28: 60 ug/min via INTRAVENOUS
  Administered 2023-02-28: 80 ug/min via INTRAVENOUS

## 2023-02-28 MED ORDER — SIMETHICONE 80 MG PO CHEW: 80.0000 mg | CHEWABLE_TABLET | ORAL | Status: DC | PRN

## 2023-02-28 MED ORDER — SODIUM CHLORIDE 0.9 % IV SOLN
500.0000 mg | INTRAVENOUS | Status: DC
  Filled 2023-02-28: qty 5

## 2023-02-28 MED ORDER — BUPIVACAINE IN DEXTROSE 0.75-8.25 % IT SOLN
INTRATHECAL | Status: DC | PRN
  Administered 2023-02-28: 1.5 mL via INTRATHECAL

## 2023-02-28 MED ORDER — NALOXONE HCL 4 MG/10ML IJ SOLN: 1.0000 ug/kg/h | INTRAVENOUS | Status: DC | PRN

## 2023-02-28 MED ORDER — FENTANYL CITRATE (PF) 100 MCG/2ML IJ SOLN: 25.0000 ug | INTRAMUSCULAR | Status: DC | PRN

## 2023-02-28 MED ORDER — SCOPOLAMINE 1 MG/3DAYS TD PT72
MEDICATED_PATCH | TRANSDERMAL | Status: DC | PRN
  Administered 2023-02-28: 1 via TRANSDERMAL

## 2023-02-28 MED ORDER — ONDANSETRON HCL 4 MG/2ML IJ SOLN
INTRAMUSCULAR | Status: AC
  Filled 2023-02-28: qty 2

## 2023-02-28 MED ORDER — DIPHENHYDRAMINE HCL 50 MG/ML IJ SOLN
12.5000 mg | INTRAMUSCULAR | Status: DC | PRN
  Administered 2023-02-28 – 2023-03-01 (×2): 12.5 mg via INTRAVENOUS
  Filled 2023-02-28 (×2): qty 1

## 2023-02-28 MED ORDER — OXYCODONE HCL 5 MG PO TABS
5.0000 mg | ORAL_TABLET | ORAL | Status: DC | PRN
  Administered 2023-03-01 – 2023-03-02 (×2): 10 mg via ORAL
  Administered 2023-03-02: 5 mg via ORAL
  Administered 2023-03-02 – 2023-03-03 (×3): 10 mg via ORAL
  Filled 2023-02-28 (×6): qty 2
  Filled 2023-02-28: qty 1

## 2023-02-28 MED ORDER — MORPHINE SULFATE (PF) 0.5 MG/ML IJ SOLN
INTRAMUSCULAR | Status: DC | PRN
  Administered 2023-02-28: .15 mg via INTRATHECAL

## 2023-02-28 MED ORDER — PHENYLEPHRINE 80 MCG/ML (10ML) SYRINGE FOR IV PUSH (FOR BLOOD PRESSURE SUPPORT)
PREFILLED_SYRINGE | INTRAVENOUS | Status: AC
  Filled 2023-02-28: qty 10

## 2023-02-28 MED ORDER — SENNOSIDES-DOCUSATE SODIUM 8.6-50 MG PO TABS
2.0000 | ORAL_TABLET | Freq: Every day | ORAL | Status: DC
  Administered 2023-03-01 – 2023-03-03 (×3): 2 via ORAL
  Filled 2023-02-28 (×3): qty 2

## 2023-02-28 MED ORDER — MORPHINE SULFATE (PF) 0.5 MG/ML IJ SOLN
INTRAMUSCULAR | Status: AC
  Filled 2023-02-28: qty 10

## 2023-02-28 MED ORDER — TRANEXAMIC ACID-NACL 1000-0.7 MG/100ML-% IV SOLN: 1000.0000 mg | Freq: Once | INTRAVENOUS | Status: DC

## 2023-02-28 MED ORDER — TRANEXAMIC ACID-NACL 1000-0.7 MG/100ML-% IV SOLN
INTRAVENOUS | Status: DC | PRN
  Administered 2023-02-28: 1000 mg via INTRAVENOUS

## 2023-02-28 MED ORDER — LACTATED RINGERS IV SOLN: INTRAVENOUS | Status: DC

## 2023-02-28 MED ORDER — SODIUM CHLORIDE 0.9% FLUSH: 3.0000 mL | INTRAVENOUS | Status: DC | PRN

## 2023-02-28 MED ORDER — KETOROLAC TROMETHAMINE 30 MG/ML IJ SOLN: 30.0000 mg | Freq: Four times a day (QID) | INTRAMUSCULAR | Status: DC | PRN

## 2023-02-28 MED ORDER — CEFAZOLIN SODIUM-DEXTROSE 2-4 GM/100ML-% IV SOLN
2.0000 g | INTRAVENOUS | Status: AC
  Administered 2023-02-28: 2 g via INTRAVENOUS
  Filled 2023-02-28: qty 100

## 2023-02-28 MED ORDER — DIBUCAINE (PERIANAL) 1 % EX OINT: 1.0000 | TOPICAL_OINTMENT | CUTANEOUS | Status: DC | PRN

## 2023-02-28 MED ORDER — CEFAZOLIN SODIUM-DEXTROSE 2-4 GM/100ML-% IV SOLN: 2.0000 g | INTRAVENOUS | Status: DC

## 2023-02-28 MED ORDER — DIPHENHYDRAMINE HCL 25 MG PO CAPS: 25.0000 mg | ORAL_CAPSULE | Freq: Four times a day (QID) | ORAL | Status: DC | PRN

## 2023-02-28 MED ORDER — WITCH HAZEL-GLYCERIN EX PADS: 1.0000 | MEDICATED_PAD | CUTANEOUS | Status: DC | PRN

## 2023-02-28 MED ORDER — MISOPROSTOL 200 MCG PO TABS
800.0000 ug | ORAL_TABLET | Freq: Once | ORAL | Status: AC | PRN
  Administered 2023-02-28: 800 ug via RECTAL

## 2023-02-28 MED ORDER — STERILE WATER FOR IRRIGATION IR SOLN
Status: DC | PRN
  Administered 2023-02-28: 1000 mL

## 2023-02-28 MED ORDER — ZOLPIDEM TARTRATE 5 MG PO TABS: 5.0000 mg | ORAL_TABLET | Freq: Every evening | ORAL | Status: DC | PRN

## 2023-02-28 MED ORDER — OXYTOCIN-SODIUM CHLORIDE 30-0.9 UT/500ML-% IV SOLN
2.5000 [IU]/h | INTRAVENOUS | Status: AC
  Administered 2023-03-01: 2.5 [IU]/h via INTRAVENOUS
  Filled 2023-02-28: qty 500

## 2023-02-28 MED ORDER — MEPERIDINE HCL 25 MG/ML IJ SOLN: 6.2500 mg | INTRAMUSCULAR | Status: DC | PRN

## 2023-02-28 MED ORDER — KETOROLAC TROMETHAMINE 30 MG/ML IJ SOLN
30.0000 mg | Freq: Four times a day (QID) | INTRAMUSCULAR | Status: AC
  Administered 2023-02-28 – 2023-03-01 (×4): 30 mg via INTRAVENOUS
  Filled 2023-02-28 (×4): qty 1

## 2023-02-28 MED ORDER — NALOXONE HCL 0.4 MG/ML IJ SOLN: 0.4000 mg | INTRAMUSCULAR | Status: DC | PRN

## 2023-02-28 MED ORDER — SODIUM CHLORIDE 0.9 % IV SOLN: INTRAVENOUS | Status: DC | PRN

## 2023-02-28 MED ORDER — MEASLES, MUMPS & RUBELLA VAC IJ SOLR
0.5000 mL | Freq: Once | INTRAMUSCULAR | Status: AC
  Administered 2023-03-03: 0.5 mL via SUBCUTANEOUS
  Filled 2023-02-28: qty 0.5

## 2023-02-28 MED ORDER — ACETAMINOPHEN 10 MG/ML IV SOLN
INTRAVENOUS | Status: AC
  Filled 2023-02-28: qty 100

## 2023-02-28 MED ORDER — DEXAMETHASONE SODIUM PHOSPHATE 4 MG/ML IJ SOLN
INTRAMUSCULAR | Status: AC
  Filled 2023-02-28: qty 1

## 2023-02-28 MED ORDER — COCONUT OIL OIL: 1.0000 | TOPICAL_OIL | Status: DC | PRN

## 2023-02-28 MED ORDER — FENTANYL CITRATE (PF) 100 MCG/2ML IJ SOLN
INTRAMUSCULAR | Status: DC | PRN
  Administered 2023-02-28: 15 ug via INTRATHECAL

## 2023-02-28 MED ORDER — METHYLERGONOVINE MALEATE 0.2 MG/ML IJ SOLN
INTRAMUSCULAR | Status: AC
  Filled 2023-02-28: qty 1

## 2023-02-28 MED ORDER — IBUPROFEN 600 MG PO TABS
600.0000 mg | ORAL_TABLET | Freq: Four times a day (QID) | ORAL | Status: DC
  Administered 2023-03-01 – 2023-03-03 (×8): 600 mg via ORAL
  Filled 2023-02-28 (×8): qty 1

## 2023-02-28 MED ORDER — FENTANYL CITRATE (PF) 100 MCG/2ML IJ SOLN
INTRAMUSCULAR | Status: AC
  Filled 2023-02-28: qty 2

## 2023-02-28 MED ORDER — ONDANSETRON HCL 4 MG/2ML IJ SOLN: 4.0000 mg | Freq: Three times a day (TID) | INTRAMUSCULAR | Status: DC | PRN

## 2023-02-28 SURGICAL SUPPLY — 43 items
ADH SKN CLS APL DERMABOND .7 (GAUZE/BANDAGES/DRESSINGS)
APL PRP STRL LF DISP 70% ISPRP (MISCELLANEOUS) ×2
APL SKNCLS STERI-STRIP NONHPOA (GAUZE/BANDAGES/DRESSINGS) ×1
BENZOIN TINCTURE PRP APPL 2/3 (GAUZE/BANDAGES/DRESSINGS) IMPLANT
CHLORAPREP W/TINT 26 (MISCELLANEOUS) ×2 IMPLANT
CLAMP UMBILICAL CORD (MISCELLANEOUS) ×1 IMPLANT
CLOTH BEACON ORANGE TIMEOUT ST (SAFETY) ×1 IMPLANT
DERMABOND ADVANCED .7 DNX12 (GAUZE/BANDAGES/DRESSINGS) IMPLANT
DRAPE C SECTION CLR SCREEN (DRAPES) ×1 IMPLANT
DRSG OPSITE POSTOP 4X10 (GAUZE/BANDAGES/DRESSINGS) ×1 IMPLANT
ELECT REM PT RETURN 9FT ADLT (ELECTROSURGICAL) ×1
ELECTRODE REM PT RTRN 9FT ADLT (ELECTROSURGICAL) ×1 IMPLANT
EXTRACTOR VACUUM KIWI (MISCELLANEOUS) ×1 IMPLANT
GAUZE PAD ABD 7.5X8 STRL (GAUZE/BANDAGES/DRESSINGS) IMPLANT
GAUZE SPONGE 4X4 12PLY STRL LF (GAUZE/BANDAGES/DRESSINGS) IMPLANT
GLOVE BIOGEL PI IND STRL 7.0 (GLOVE) ×2 IMPLANT
GLOVE SURG SS PI 6.5 STRL IVOR (GLOVE) ×1 IMPLANT
GOWN STRL REUS W/TWL LRG LVL3 (GOWN DISPOSABLE) ×2 IMPLANT
HEMOSTAT SURGICEL 2X14 (HEMOSTASIS) IMPLANT
KIT ABG SYR 3ML LUER SLIP (SYRINGE) IMPLANT
MAT PREVALON FULL STRYKER (MISCELLANEOUS) IMPLANT
NDL HYPO 25X5/8 SAFETYGLIDE (NEEDLE) IMPLANT
NDL KEITH (NEEDLE) ×1 IMPLANT
NEEDLE HYPO 25X5/8 SAFETYGLIDE (NEEDLE) IMPLANT
NEEDLE KEITH (NEEDLE) ×1 IMPLANT
NS IRRIG 1000ML POUR BTL (IV SOLUTION) ×1 IMPLANT
PACK C SECTION WH (CUSTOM PROCEDURE TRAY) ×1 IMPLANT
PAD OB MATERNITY 4.3X12.25 (PERSONAL CARE ITEMS) ×1 IMPLANT
RTRCTR C-SECT PINK 25CM LRG (MISCELLANEOUS) ×1 IMPLANT
STRIP CLOSURE SKIN 1/2X4 (GAUZE/BANDAGES/DRESSINGS) IMPLANT
SUT CHROMIC 1 CTX 36 (SUTURE) IMPLANT
SUT CHROMIC 2 0 CT 1 (SUTURE) ×1 IMPLANT
SUT PLAIN 1 NONE 54 (SUTURE) IMPLANT
SUT PLAIN 2 0 (SUTURE)
SUT PLAIN 2 0 XLH (SUTURE) IMPLANT
SUT PLAIN ABS 2-0 CT1 27XMFL (SUTURE) IMPLANT
SUT VIC AB 0 CTX 36 (SUTURE) ×1
SUT VIC AB 0 CTX36XBRD ANBCTRL (SUTURE) ×1 IMPLANT
SUT VIC AB 1 CTX 36 (SUTURE) ×2
SUT VIC AB 1 CTX36XBRD ANBCTRL (SUTURE) ×2 IMPLANT
TOWEL OR 17X24 6PK STRL BLUE (TOWEL DISPOSABLE) ×1 IMPLANT
TRAY FOLEY W/BAG SLVR 14FR LF (SET/KITS/TRAYS/PACK) ×1 IMPLANT
WATER STERILE IRR 1000ML POUR (IV SOLUTION) ×1 IMPLANT

## 2023-02-28 NOTE — Anesthesia Postprocedure Evaluation (Signed)
Anesthesia Post Note  Patient: Hydrographic surveyor  Procedure(s) Performed: CESAREAN SECTION (Abdomen)     Patient location during evaluation: PACU Anesthesia Type: Spinal Level of consciousness: oriented and awake and alert Pain management: pain level controlled Vital Signs Assessment: post-procedure vital signs reviewed and stable Respiratory status: spontaneous breathing, respiratory function stable and nonlabored ventilation Cardiovascular status: blood pressure returned to baseline and stable Postop Assessment: no headache, no backache, no apparent nausea or vomiting and spinal receding Anesthetic complications: no   No notable events documented.  Last Vitals:  Vitals:   02/28/23 1615 02/28/23 1635  BP: 104/81 123/76  Pulse: 79 70  Resp: 20 17  Temp: 36.6 C   SpO2: 98% 100%    Last Pain:  Vitals:   02/28/23 1615  TempSrc: Temporal  PainSc: 0-No pain   Pain Goal:    LLE Motor Response: No movement due to regional block (02/28/23 1615)   RLE Motor Response: No movement due to regional block (02/28/23 1615)       Epidural/Spinal Function Cutaneous sensation: Pins and Needles (rock legs side to side) (02/28/23 1615), Patient able to flex knees: No (02/28/23 1615), Patient able to lift hips off bed: No (02/28/23 1615), Back pain beyond tenderness at insertion site: No (02/28/23 1615), Progressively worsening motor and/or sensory loss: No (02/28/23 1615), Bowel and/or bladder incontinence post epidural: No (02/28/23 1615)  Derrious Bologna A.

## 2023-02-28 NOTE — Lactation Note (Signed)
This note was copied from a baby's chart. Lactation Consultation Note  Patient Name: Karen Berg ZOXWR'U Date: 02/28/2023 Age:39 hours  Reason for consult: Initial assessment;Early term 37-38.6wks;Other (Comment) (AMA)  P2, GA [redacted]w[redacted]d  Mother is post C/S delivery. She states baby has breast fed very well. Currently baby is sleeping on mother's chest skin to skin with visitors present.  Encouraged to feed baby with feeding cues, skin to skin and call for nurse to assist with breastfeeding if needed. Mom made aware of O/P services, breastfeeding support groups, community resources, and our phone # for post-discharge questions.      Maternal Data Does the patient have breastfeeding experience prior to this delivery?: Yes How long did the patient breastfeed?: 3 years  Feeding Mother's Current Feeding Choice: Breast Milk   Interventions Interventions: Education;LC Services brochure   Consult Status Consult Status: Follow-up Date: 03/01/23 Follow-up type: In-patient    Christella Hartigan M 02/28/2023, 6:48 PM

## 2023-02-28 NOTE — Transfer of Care (Signed)
Immediate Anesthesia Transfer of Care Note  Patient: Karen Berg  Procedure(s) Performed: CESAREAN SECTION (Abdomen)  Patient Location: PACU  Anesthesia Type:Spinal  Level of Consciousness: awake, alert , and oriented  Airway & Oxygen Therapy: Patient Spontanous Breathing  Post-op Assessment: Report given to RN and Post -op Vital signs reviewed and stable  Post vital signs: Reviewed and stable  Last Vitals:  Vitals Value Taken Time  BP 110/74 02/28/23 1541  Temp    Pulse 72 02/28/23 1543  Resp 15 02/28/23 1543  SpO2 97 % 02/28/23 1543  Vitals shown include unvalidated device data.  Last Pain:  Vitals:   02/28/23 1315  TempSrc: Oral         Complications: No notable events documented.

## 2023-02-28 NOTE — Anesthesia Procedure Notes (Signed)
Spinal  Patient location during procedure: OR Start time: 02/28/2023 2:02 PM End time: 02/28/2023 2:06 PM Reason for block: surgical anesthesia Staffing Performed: anesthesiologist  Anesthesiologist: Mal Amabile, MD Performed by: Mal Amabile, MD Authorized by: Mal Amabile, MD   Preanesthetic Checklist Completed: patient identified, IV checked, site marked, risks and benefits discussed, surgical consent, monitors and equipment checked, pre-op evaluation and timeout performed Spinal Block Patient position: sitting Prep: DuraPrep and site prepped and draped Patient monitoring: heart rate, cardiac monitor, continuous pulse ox and blood pressure Approach: midline Location: L3-4 Injection technique: single-shot Needle Needle type: Pencan  Needle gauge: 24 G Needle length: 9 cm Needle insertion depth: 6 cm Assessment Sensory level: T4 Events: CSF return Additional Notes Patient tolerated procedure well. Adequate sensory level.

## 2023-02-28 NOTE — Op Note (Signed)
Cesarean Section Procedure Note  Indications: previous uterine incision x1 and patient desires repeat  Pre-operative Diagnosis: 37 y.o. G3P1011 [redacted]w[redacted]d Previous cesarean section for NRFHR Desires repeat cesarean SROM  Uterus fibroids GBS positive   Post-operative Diagnosis: same  Surgeon: Jackie Plum   Assistants: Dr. Nobie Putnam (OB fellow)  Anesthesia: Spinal anesthesia  ASA Class: 2   Procedure Details  CS OP NOTE Information for the patient's newborn:  Matalie, Stefanich [[id number]]  @[id number] @    Indication and Consent:  Patient reported for scheduled repeat cesarean section after SROM with clear fluid at [redacted]w[redacted]d. Pt has a history of cesarean section x1 with primary section indicated due to NRFHT. She has received adequate prenatal care in current pregnancy by [facility]. Risks discussed include bleeding, infection, injury to surrounding organs including but not limited to bowel, bladder, and ureters. Also discussed risk of hysterectomy in case of life-threatening emergency, and death. Pt verbalizes understanding and wishes to proceed.  Procedures: The patient was taken to the operating room where anesthesia was found to be adequate and preoperative antibiotics were given for infection prophylaxis. She was prepared and draped in the dorsal supine position with a leftward tilt. A Pfannenstiel skin incision was made with scalpel. The incision was carried down to the fascia with Bovie electrocautery. The fascia was incised and extended laterally. The superior aspect of the fascia was grasped with Kocher clamps and the rectus muscle was dissected off with mayo scissors. In a similar fashion, the inferior aspect of the fascia was elevated and the underlying rectus muscle and pyramidalis were dissected off. Hemostasis was achieved with the bovie. The rectus muscle was separated in the midline down to the level of pubic symphysis. Pre-peritoneal fatty tissue was extended superiorly and  inferiorly to the bladder reflection with good visualization of the bladder.   The Alexis-O retractor was inserted and vesicouterine peritoneum identified. Intraabdominal survey revealed scant, clear peritoneal fluid and a thin lower uterine segment. The vesicouterine peritoneum was opened with scissors and the bladder flap was developed. A low transverse uterine incision was made with a scalpel, the amniotic sac was ruptured and clear fluid noted. The uterine incision was extended bluntly with lateral and upward traction.   The fetus was in cephalic presentation. The head was elevated out of the pelvis with special attention paid to avoid using the uterine incision as a fulcrum. Gentle fundal pressure was applied once the head was brought into the incision. The infant was delivered with no difficulty. The mouth and nose were suctioned with a bulb. The cord was clamped and cut. The infant was handed off to the awaiting NICU staff for evaluation. The placenta was delivered intact with manual massage of uterine fundus. IV oxytocin was initiated to facilitated uterine contrations.  The inside of uterus was wiped clean. The uterine incision was closed first with 0 Vicryl in a locking pattern, second imbricating layer by 0 Monocryl  in a running pattern. Hemostasis was achieved with a figure of eight stitch using 0 Vicryl. Multiple fibroids palpable with largest one approximately 7 cm anterior left. Blood clots and fluid were wiped out of the abdomen and pelvis with moist laparotomy sponges. The uterine incision was reinspected and good hemostasis was noted.   The peritoneum was closed with 3-0 Vicryl in a continuous pattern. The muscle layer was reapproximated with 2-0 Vicryl in an interupted pattern. The fascial layer was closed with 0 Vicryl suture. Scarpa's fascia was reapproximated with 2-0 plain gut suture in a  continuous fashion. The skin was closed with 4-0 monocryl subcuticularly. The patient tolerated the  procedure well. All the counts were correct times three. The patient was taken to the recovery room in a stable condition. Dr. Hester Mates was present for the entire procedure and Dr. Nobie Putnam [facility] fellow) assisted.    Findings: Liveborn female infant, Apgars 9 and 9 at 1 and five minutes respectively, multiple fibroids palpable with largest one approximately 7 cm anterior left.   Estimated Blood Loss:  650 mL         Urine: 300 mL, clear         Total IV Fluids:  1500 mL          Specimens: Placenta to L&D         Complications:  None; patient tolerated the procedure well.         Disposition: PACU - hemodynamically stable.         Condition: stable  Attending Attestation: I was present and scrubbed and the assistant was required due to complexity of anatomy. Due to the complexity of the case, Dr. Nobie Putnam (OB fellow)  was asked to serve as surgeon assist. The assistant surgeon aided in safe handling of the pelvic structures, associated retraction, and exposure of the operative field.   Dr. Reesa Chew

## 2023-02-28 NOTE — Anesthesia Preprocedure Evaluation (Signed)
Anesthesia Evaluation  Patient identified by MRN, date of birth, ID band Patient awake    Reviewed: Allergy & Precautions, NPO status , Patient's Chart, lab work & pertinent test results  History of Anesthesia Complications (+) Family history of anesthesia reaction  Airway Mallampati: II  TM Distance: >3 FB Neck ROM: Full    Dental no notable dental hx. (+) Teeth Intact, Dental Advisory Given   Pulmonary    Pulmonary exam normal breath sounds clear to auscultation       Cardiovascular negative cardio ROS Normal cardiovascular exam Rhythm:Regular Rate:Normal     Neuro/Psych  Headaches PSYCHIATRIC DISORDERS  Depression    Hx/o Post Partum depression   GI/Hepatic Neg liver ROS,GERD  Medicated,,  Endo/Other  Obesity  Renal/GU negative Renal ROS     Musculoskeletal   Abdominal  (+) + obese  Peds  Hematology   Anesthesia Other Findings   Reproductive/Obstetrics (+) Pregnancy Hx/o previous C/Section PPROM                              Anesthesia Physical Anesthesia Plan  ASA: 2 and emergent  Anesthesia Plan: Spinal   Post-op Pain Management: Minimal or no pain anticipated and Regional block*   Induction:   PONV Risk Score and Plan: 4 or greater and Treatment may vary due to age or medical condition and Scopolamine patch - Pre-op  Airway Management Planned: Natural Airway  Additional Equipment:   Intra-op Plan:   Post-operative Plan:   Informed Consent: I have reviewed the patients History and Physical, chart, labs and discussed the procedure including the risks, benefits and alternatives for the proposed anesthesia with the patient or authorized representative who has indicated his/her understanding and acceptance.     Dental advisory given  Plan Discussed with: CRNA and Anesthesiologist  Anesthesia Plan Comments:          Anesthesia Quick Evaluation

## 2023-02-28 NOTE — H&P (Signed)
OB ADMISSION/ HISTORY & PHYSICAL:  Admission Date: 02/28/2023  9:45 AM  Admit Diagnosis: Normal labor  Karen Berg is a 37 y.o. female G3P1011 [redacted]w[redacted]d presenting for loss of fluid. Endorses active FM, denies vaginal bleeding. Does not perceive regular contractions. Hx of primary cesarean and desires repeat cesarean   History of current pregnancy: G3P1011   Patient entered care with [facility] at 8+5 wks.   EDC 03/16/23 by 9 wk US   Anatomy scan:  19 wks, complete w/ anterior placenta.   Antenatal testing: for polyhydramnios and AMA started at 32 weeks Last evaluation: 35+4 wks cephalic/ anterior placenta/ AFI 19/ EFW 6+5 (33%)  Significant prenatal events:  Patient Active Problem List   Diagnosis Date Noted   Previous cesarean section 02/28/2023   Delayed delivery after SROM (spontaneous rupture of membranes) 02/28/2023   AMA (advanced maternal age) multigravida 35+, third trimester 02/28/2023   Hx of migraines 02/28/2023   Rubella non-immune status, antepartum 02/28/2023   Fibroid uterus 09/24/2021   S/P primary low transverse C-section 06/09/2017    Prenatal Labs: ABO, Rh: --/--/O POS (05/13 1051) Antibody: NEG (05/13 1051) Rubella:   NONIMMUNE RPR:   NR HBsAg:   NR HIV:   NR GTT: normal 1 hr GBS:   positive GC/CHL: neg/neg Genetics: low-risk female Vaccines: Tdap: yes Influenza: no   OB History  Gravida Para Term Preterm AB Living  3 1 1   1 1   SAB IAB Ectopic Multiple Live Births  1     0 1    # Outcome Date GA Lbr Len/2nd Weight Sex Delivery Anes PTL Lv  3 Current           2 Term 06/09/17 [redacted]w[redacted]d 01:14 / 00:28 3670 g M CS-LTranv Gen  LIV  1 SAB             Medical / Surgical History: Past medical history:  Past Medical History:  Diagnosis Date   Depression    Postpartum   Eczema    Eczema 01/20/2015   Overview:   Triamcinolone 0.5   Family history of anesthesia complication    Mother had PONV   Fibroids    uterine fibroids   GERD (gastroesophageal  reflux disease)    History of postpartum depression 09/24/2021   Migraine    Vaginal Pap smear, abnormal     Past surgical history:  Past Surgical History:  Procedure Laterality Date   CESAREAN SECTION N/A 06/09/2017   Procedure: CESAREAN SECTION;  Surgeon: Myna Hidalgo, DO;  Location: [facility];  Service: Obstetrics;  Laterality: N/A;   CHOLECYSTECTOMY N/A 02/05/2014   Procedure: LAPAROSCOPIC CHOLECYSTECTOMY WITH INTRAOPERATIVE CHOLANGIOGRAM;  Surgeon: Wilmon Arms. Corliss Skains, MD;  Location: [facility] OR;  Service: General;  Laterality: N/A;   DILATION AND EVACUATION N/A 09/25/2021   Procedure: DILATATION AND EVACUATION;  Surgeon: Essie Hart, MD;  Location: [facility] OR;  Service: Gynecology;  Laterality: N/A;   OPERATIVE ULTRASOUND N/A 09/25/2021   Procedure: OPERATIVE ULTRASOUND;  Surgeon: Essie Hart, MD;  Location: [facility] OR;  Service: Gynecology;  Laterality: N/A;   WISDOM TOOTH EXTRACTION     Family History:  Family History  Problem Relation Age of Onset   Asthma Sister    Cancer Maternal Grandfather    Lung cancer Maternal Grandfather    Hypertension Paternal Grandmother    Diabetes Paternal Grandmother    Prostate cancer Paternal Grandfather    Diabetes Other    Cancer - Prostate Other    Cancer - Lung  Other    Colon cancer Neg Hx    Esophageal cancer Neg Hx    Heart disease Neg Hx     Social History:  reports that she has never smoked. She has never used smokeless tobacco. She reports that she does not drink alcohol and does not use drugs.  Allergies: No known allergies   Current Medications at time of admission:  Prior to Admission medications   Medication Sig Start Date End Date Taking? Authorizing Provider  ferrous sulfate 325 (65 FE) MG EC tablet Take 325 mg by mouth 3 (three) times daily with meals.   Yes [provider]  magnesium oxide (MAG-OX) 400 (240 Mg) MG tablet Take 400 mg by mouth daily.   Yes [provider]  Prenatal Vit-Fe Fumarate-FA (PRENATAL  PO) Take 2 tablets by mouth daily in the afternoon. 2 gummies a day   Yes [provider]  levocetirizine (XYZAL) 5 MG tablet Take 1 tablet (5 mg total) by mouth every evening. 03/11/22 09/07/22  Theadora Rama Scales, PA-C  loratadine (CLARITIN) 10 MG tablet Take 1 tablet (10 mg total) by mouth daily. 03/11/22 09/07/22  Theadora Rama Scales, PA-C  triamcinolone ointment (KENALOG) 0.5 % Apply 1 application topically 2 (two) times daily as needed (skin irritation.).    [provider]    Review of Systems: Constitutional: Negative   HENT: Negative   Eyes: Negative   Respiratory: Negative   Cardiovascular: Negative   Gastrointestinal: Negative  Genitourinary: neg for bloody show, pos for LOF   Musculoskeletal: Negative   Skin: Negative   Neurological: Negative   Endo/Heme/Allergies: Negative   Psychiatric/Behavioral: Negative    Physical Exam: VS: Blood pressure 136/86, pulse 72, temperature 98.1 F (36.7 C), temperature source Oral, resp. rate 18, height 5\' 1"  (1.549 m), weight 90.2 kg, last menstrual period 06/09/2022, SpO2 99 %, unknown if currently breastfeeding. AAO x3, no signs of distress Cardiovascular: RRR Respiratory: Unlabored GU/GI: Abdomen gravid, non-tender, non-distended, active FM, vertex Extremities: no edema, negative for pain, tenderness, and cords  Cervical exam:  deferred FHR: baseline rate 145 / variability moderate / accelerations absent / absent decelerations TOCO: 5-6   Prenatal Transfer Tool  Maternal Diabetes: No Genetic Screening: Normal Maternal Ultrasounds/Referrals: Normal Fetal Ultrasounds or other Referrals:  None Maternal Substance Abuse:  No Significant Maternal Medications:  None Significant Maternal Lab Results: Group B Strep positive Number of Prenatal Visits:greater than 3 verified prenatal visits Other Comments:  None    Assessment: 37 y.o. G3P1011 [redacted]w[redacted]d Previous cesarean section for NRFHR Desires repeat  cesarean  SROM without labor FHR category 1 GBS positive Pain management plan: per anesthesia   Plan:  Admit to L&D OR Routine pre-op admission orders NPO since 1900 Dr Hester Mates notified of pt's arrival by MAU  Roma Schanz DNP, CNM 02/28/2023 11:31 AM

## 2023-02-28 NOTE — MAU Note (Signed)
Karen Berg is a 37 y.o. at [redacted]w[redacted]d here in MAU reporting: woke up around 0700, felt like she had to use the restroom, but couldn't hold it.  Fluid was urine.  Called office, told to monitor for further leaking.  When she sits/stands- she has had morning leaking- now clear and watery. No bleeding. No contractions. Reports fetal movement, but less than usual.  Onset of complaint: 0700 Pain score: none Vitals:   02/28/23 1009  BP: (!) 141/85  Pulse: 78  Resp: 18  Temp: 98.1 F (36.7 C)  SpO2: 99%     FHT:140 Lab orders placed from triage:  urine collected

## 2023-03-01 ENCOUNTER — Other Ambulatory Visit: Payer: Self-pay

## 2023-03-01 LAB — CBC
HCT: 29.5 % — ABNORMAL LOW (ref 36.0–46.0)
Hemoglobin: 10 g/dL — ABNORMAL LOW (ref 12.0–15.0)
MCH: 31.3 pg (ref 26.0–34.0)
MCHC: 33.9 g/dL (ref 30.0–36.0)
MCV: 92.2 fL (ref 80.0–100.0)
Platelets: 172 10*3/uL (ref 150–400)
RBC: 3.2 MIL/uL — ABNORMAL LOW (ref 3.87–5.11)
RDW: 15.7 % — ABNORMAL HIGH (ref 11.5–15.5)
WBC: 7.6 10*3/uL (ref 4.0–10.5)
nRBC: 0 % (ref 0.0–0.2)

## 2023-03-01 LAB — COMPREHENSIVE METABOLIC PANEL
ALT: 33 U/L (ref 0–44)
AST: 36 U/L (ref 15–41)
Albumin: 2.4 g/dL — ABNORMAL LOW (ref 3.5–5.0)
Alkaline Phosphatase: 60 U/L (ref 38–126)
Anion gap: 9 (ref 5–15)
BUN: 6 mg/dL (ref 6–20)
CO2: 20 mmol/L — ABNORMAL LOW (ref 22–32)
Calcium: 8.3 mg/dL — ABNORMAL LOW (ref 8.9–10.3)
Chloride: 103 mmol/L (ref 98–111)
Creatinine, Ser: 0.71 mg/dL (ref 0.44–1.00)
GFR, Estimated: >60 mL/min (ref >60)
Glucose, Bld: 87 mg/dL (ref 70–99)
Potassium: 4 mmol/L (ref 3.5–5.1)
Sodium: 132 mmol/L — ABNORMAL LOW (ref 135–145)
Total Bilirubin: 0.6 mg/dL (ref 0.3–1.2)
Total Protein: 5.4 g/dL — ABNORMAL LOW (ref 6.5–8.1)

## 2023-03-01 LAB — LACTATE DEHYDROGENASE: LDH: 236 U/L — ABNORMAL HIGH (ref 98–192)

## 2023-03-01 NOTE — Progress Notes (Signed)
CSW received a consult for history of PPD. CSW met MOB at bedside to complete mental health assessment. CSW entered the room, introduced herself and acknowledged guest was present. MOB gave CSW verbal permission to speak about anything personal while guest was present. CSW explained her role and the reason for the visit. MOB was polite, easy to engage, receptive to meeting with CSW, and appeared forthcoming.   CSW asked MOB about her mental health history. 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 MOB reported symptoms that included feeling overwhelmed and emotional. MOB reported contacting a medical professional for support; and was prescribed a low dose of Zoloft, and began participating in therapy. MOB reported symptoms lasting for months and was able to eventually stop the medication. MOB reported a plan of action for this PP period; which includes the same scenario of returning to medication, and participating in therapy for support of symptoms. CSW provided education regarding the baby blues period vs. perinatal mood disorders, discussed treatment and gave resources for mental health follow up if concerns arise.  CSW recommends self-evaluation during the postpartum time period using the New Mom Checklist from Postpartum Progress and encouraged MOB to contact a medical professional if symptoms are noted at any time. CSW assessed for safety with MOB SI and HI; MOB denied all. CSW did not assess for DV; guest was present.   CSW asked MOB has she chosen a pediatrician for infant's follow up visits; MOB said [facility]. MOB reported having all essential items for infant including a car seat, bassinet and crib for safe sleeping. CSW provided review of Sudden Infant Death Syndrome (SIDS) precautions.   CSW identifies no further need for intervention and no barriers to discharge at this time.  Bryden Darden, LCSWA Clinical Social  Worker [phone]     

## 2023-03-01 NOTE — Lactation Note (Signed)
This note was copied from a baby's chart. Lactation Consultation Note  Patient Name: Karen Berg [id number] Date: 03/01/2023 Age:39 hours  Reason for consult: Follow-up assessment;Infant weight loss;Early term 37-38.6wks  P2, GA [redacted]w[redacted]d, 5% weight loss in less than 24 hours  Upon entry to room, mother had baby latched well to the breast. Mother's goal is to exclusively breastfeed. Mother states baby latches well but falls asleep during the feedings. Due to weight loss and early term infant, mother wants to add pumping to stimulate her  milk supply for baby. RN started mother pumping.  Maternal Data Has patient been taught Hand Expression?: Yes Does the patient have breastfeeding experience prior to this delivery?: Yes  Feeding Mother's Current Feeding Choice: Breast Milk  LATCH Score Latch: Grasps breast easily, tongue down, lips flanged, rhythmical sucking.  Audible Swallowing: A few with stimulation  Type of Nipple: Everted at rest and after stimulation  Comfort (Breast/Nipple): Soft / non-tender  Hold (Positioning): No assistance needed to correctly position infant at breast.  LATCH Score: 9   Lactation Tools Discussed/Used Tools: Pump;Flanges Breast pump type: Double-Electric Breast Pump Pump Education: Setup, frequency, and cleaning;Milk Storage Reason for Pumping: additional stimulation to promote milk production at mother's request  Interventions Interventions: DEBP;Education      Consult Status Consult Status: Follow-up Date: 03/02/23 Follow-up type: In-patient    Christella Hartigan M 03/01/2023, 6:38 PM

## 2023-03-01 NOTE — Progress Notes (Signed)
Subjective: POD# 1 Information for the patient's newborn:  Paulann, Delucca [[id number]]  female   Baby's Name Reagan   Reports feeling good Feeding: breast Reports tolerating PO and denies N/V, foley removed, ambulating and urinating w/o difficulty  Pain controlled with  PO meds Denies HA/SOB/dizziness  Flatus passing Vaginal bleeding is normal, no clots     Objective:  VS:  Vitals:   02/28/23 1900 02/28/23 2010 03/01/23 0030 03/01/23 0435  BP: 125/79 134/78  119/82  Pulse: 85 84  81  Resp: 16 16 18 18   Temp: 98.7 F (37.1 C) 98.5 F (36.9 C) 98.3 F (36.8 C) 98.1 F (36.7 C)  TempSrc:  Oral Oral Oral  SpO2: 96%  99% 100%  Weight:      Height:        Intake/Output Summary (Last 24 hours) at 03/01/2023 0747 Last data filed at 03/01/2023 0435 Gross per 24 hour  Intake 3285.72 ml  Output 1800 ml  Net 1485.72 ml     Recent Labs    02/28/23 1051 03/01/23 0605  WBC 4.4 7.6  HGB 12.4 10.0*  HCT 37.2 29.5*  PLT 217 172    Blood type: --/--/O POS (05/13 1051) Rubella: Nonimmune (11/07 0000)    Physical Exam:  General: alert, cooperative, and no distress CV: Regular rate and rhythm or without murmur or extra heart sounds Resp: clear Abdomen: soft, nontender, tympany in all quads Incision:  pressure dressing removed, honeycomb CDI Perineum:  Uterine Fundus: firm, 4 above U due to fibroid uterus Lochia: minimal and no clots Ext:  neg for pain, tenderness, edema, and cords.    Assessment/Plan: 37 y.o.   POD# 1. [id number]                  Principal Problem:   Previous cesarean section Active Problems:   Fibroid uterus   Delayed delivery after SROM (spontaneous rupture of membranes)   AMA (advanced maternal age) multigravida 35+, third trimester   Rubella non-immune status, antepartum   Routine post-op PP care          Advance diet as tolerated Advised warm fluids and ambulation to improve GI motility Breastfeeding support Anticipate D/C  03/02/23  Roma Schanz, DNP, CNM 03/01/2023, 7:47 AM

## 2023-03-02 ENCOUNTER — Ambulatory Visit: Payer: No Typology Code available for payment source

## 2023-03-02 NOTE — Lactation Note (Signed)
This note was copied from a baby's chart. Lactation Consultation Note  Patient Name: Karen Berg [id number] Date: 03/02/2023 Age:39 hours Reason for consult: Follow-up assessment;Infant weight loss;Early term 37-38.6wks;Breastfeeding assistance (11.9% WL)  The infant was at 45 hours.  LC entered the room and the infant was asleep in the bassinet.  The birth parent was pumping.  The LC spoke with the birth parent about supplementing and supplementation guidelines.  The current plan is to supplement with expressed breast milk as outlined by Dr. Ezequiel Essex.  The birth parent is aware that if the weight loss continues to increase she may have to supplement with formula as well.  The birth parent had no further question.   Feeding Mother's Current Feeding Choice: Breast Milk  Lactation Tools Discussed/Used Tools: Pump;Flanges Flange Size: 24 Breast pump type: Double-Electric Breast Pump Pump Education: Setup, frequency, and cleaning;Milk Storage Reason for Pumping: Promote milk producation, supplementation for infant Pumping frequency: q3 Pumped volume: 6 mL  Interventions Interventions: Education   Consult Status Consult Status: Follow-up Date: 03/03/23 Follow-up type: In-patient   Delene Loll 03/02/2023, 11:44 AM

## 2023-03-03 ENCOUNTER — Other Ambulatory Visit (HOSPITAL_COMMUNITY): Payer: Self-pay

## 2023-03-03 MED ORDER — ACETAMINOPHEN 500 MG PO TABS
1000.0000 mg | ORAL_TABLET | Freq: Four times a day (QID) | ORAL | 1 refills | Status: AC | PRN
  Filled 2023-03-03: qty 100, 13d supply, fill #0

## 2023-03-03 MED ORDER — IBUPROFEN 600 MG PO TABS
600.0000 mg | ORAL_TABLET | Freq: Four times a day (QID) | ORAL | 1 refills | Status: AC | PRN
  Filled 2023-03-03: qty 30, 8d supply, fill #0

## 2023-03-03 MED ORDER — OXYCODONE HCL 5 MG PO TABS
5.0000 mg | ORAL_TABLET | Freq: Four times a day (QID) | ORAL | 0 refills | Status: AC | PRN
  Filled 2023-03-03: qty 20, 5d supply, fill #0

## 2023-03-03 MED ORDER — FERROUS SULFATE 325 (65 FE) MG PO TBEC: 325.0000 mg | DELAYED_RELEASE_TABLET | ORAL | 1 refills | Status: AC

## 2023-03-03 NOTE — Lactation Note (Signed)
This note was copied from Karen baby's chart. Lactation Consultation Note  Patient Name: Karen Berg [id number] Date: 03/03/2023 Age:39 hours Reason for consult: Follow-up assessment;Early term 37-38.6wks;Infant weight loss  LC in to room for follow up. Dyad may be going home today. Infant is sleeping upon arrival. Birthing parent states infant just finished breastfeeding. Parent reports nursing for ~15 minutes. Birthing parent talks about some nipple discomfort and LC offered nipple care information. Birthing parent has not pumped in ~12 h hours. Discussed infant weight loss and current output. Encouraged parent to supplement infant with pumped breast milk following guidelines. Birthing parent's milk seems to be transitioning and it is easily expressed.  Discussed ETI behavior and patterns, voids and stools as signs good intake, pumping, clusterfeeding, skin to skin. Talked about milk coming into volume and managing engorgement.   Plan: 1-Feeding on demand or 8-12 times in 24h period. 2-Hand express/pump as needed for supplementation 3-Encouraged birthing parent rest, hydration and food intake.   Contact LC as needed for feeds/support/concerns/questions. All questions answered at this time. Reviewed LC brochure and other resources available locally.     Maternal Data Has patient been taught Hand Expression?: Yes Does the patient have breastfeeding experience prior to this delivery?: Yes How long did the patient breastfeed?: 3 years  Feeding Mother's Current Feeding Choice: Breast Milk  LATCH Score No latch observed during this encounter.   Lactation Tools Discussed/Used Tools: Pump;Flanges Flange Size: 21 (may need an 18-mm) Breast pump type: Double-Electric Breast Pump;Manual Pump Education: Setup, frequency, and cleaning;Milk Storage Reason for Pumping: ETI Pumping frequency: after feedings Pumped volume: 10 mL (last pump session 5/15 at  10pm)  Interventions Interventions: Breast feeding basics reviewed;Skin to skin;Hand express;Breast massage;Hand pump;Coconut oil;Expressed milk;DEBP;Education;LC Services brochure  Discharge Discharge Education: Engorgement and breast care;Warning signs for feeding baby Pump: DEBP;Personal;Manual (Spectra) WIC Program: No  Consult Status Consult Status: Complete Date: 03/03/23 Follow-up type: Call as needed    Karen Berg Karen Berg 03/03/2023, 8:36 AM

## 2023-03-03 NOTE — Discharge Summary (Signed)
Postpartum Discharge Summary  Date of Service updated 03/03/23     Patient Name: Karen Berg DOB: Jun 19, 1986 MRN: [id number]  Date of admission: 02/28/2023 Delivery date:02/28/2023  Delivering provider: Reesa Chew D  Date of discharge: 03/03/2023  Admitting diagnosis: Previous cesarean section [Z98.891] Delayed delivery after SROM (spontaneous rupture of membranes) [O42.90] Intrauterine pregnancy: [redacted]w[redacted]d     Secondary diagnosis:  Principal Problem:   Previous cesarean section Active Problems:   Fibroid uterus   Delayed delivery after SROM (spontaneous rupture of membranes)   AMA (advanced maternal age) multigravida 35+, third trimester   Rubella non-immune status, antepartum  Additional problems: none    Discharge diagnosis: Term Pregnancy Delivered                                              Post partum procedures: routine care Augmentation: N/A Complications: None  Hospital course: Sceduled C/S   37 y.o. yo [id number] at [redacted]w[redacted]d was admitted to the hospital 02/28/2023 for scheduled cesarean section with the following indication:Elective Repeat.Delivery details are as follows:  Membrane Rupture Time/Date: 7:00 AM ,02/28/2023   Delivery Method:C-Section, Low Transverse  Details of operation can be found in separate operative note.  Patient had a postpartum course complicated by n/a.  She is ambulating, tolerating a regular diet, passing flatus, and urinating well. Patient is discharged home in stable condition on  03/09/23        Newborn Data: Birth date:02/28/2023  Birth time:2:31 PM  Gender:Female  Living status:Living  Apgars:9 ,9  Weight:3.11 kg     Magnesium Sulfate received: No BMZ received: No Rhophylac:No MMR:No T-DaP: see prenatal record Flu: No Transfusion:No  Physical exam  Vitals:   03/02/23 0405 03/02/23 1344 03/02/23 2121 03/03/23 0523  BP: 120/74 114/75 136/81 133/80  Pulse: 63 78 64 68  Resp: 18 16 17 16   Temp: 97.7 F (36.5 C) 97.9 F  (36.6 C) (!) 97.5 F (36.4 C) 97.7 F (36.5 C)  TempSrc: Oral Oral Oral Oral  SpO2:      Weight:      Height:       General: alert, cooperative, and no distress Lochia: appropriate Uterine Fundus: FF, NT Incision: Healing well with no significant drainage, Dressing is clean, dry, and intact DVT Evaluation: No evidence of DVT seen on physical exam. Labs: Lab Results  Component Value Date   WBC 7.6 03/01/2023   HGB 10.0 (L) 03/01/2023   HCT 29.5 (L) 03/01/2023   MCV 92.2 03/01/2023   PLT 172 03/01/2023      Latest Ref Rng & Units 03/01/2023    6:05 AM  CMP  Glucose 70 - 99 mg/dL 87   BUN 6 - 20 mg/dL 6   Creatinine 6.96 - 2.95 mg/dL 2.84   Sodium 132 - 440 mmol/L 132   Potassium 3.5 - 5.1 mmol/L 4.0   Chloride 98 - 111 mmol/L 103   CO2 22 - 32 mmol/L 20   Calcium 8.9 - 10.3 mg/dL 8.3   Total Protein 6.5 - 8.1 g/dL 5.4   Total Bilirubin 0.3 - 1.2 mg/dL 0.6   Alkaline Phos 38 - 126 U/L 60   AST 15 - 41 U/L 36   ALT 0 - 44 U/L 33    Edinburgh Score:    02/28/2023    5:00 PM  Edinburgh Postnatal Depression Scale Screening Tool  I have been able to laugh and see the funny side of things. 0  I have looked forward with enjoyment to things. 1  I have blamed myself unnecessarily when things went wrong. 0  I have been anxious or worried for no good reason. 2  I have felt scared or panicky for no good reason. 0  Things have been getting on top of me. 2  I have been so unhappy that I have had difficulty sleeping. 0  I have felt sad or miserable. 1  I have been so unhappy that I have been crying. 1  The thought of harming myself has occurred to me. 0  Edinburgh Postnatal Depression Scale Total 7      After visit meds:  Allergies as of 03/03/2023       Reactions   No Known Allergies         Medication List     TAKE these medications    acetaminophen 500 MG tablet Commonly known as: TYLENOL Take 2 tablets (1,000 mg total) by mouth every 6 (six) hours as needed  for mild pain or moderate pain.   ferrous sulfate 325 (65 FE) MG EC tablet Take 1 tablet (325 mg total) by mouth every other day. What changed: when to take this   ibuprofen 600 MG tablet Commonly known as: ADVIL Take 1 tablet (600 mg total) by mouth every 6 (six) hours as needed for mild pain, moderate pain or cramping.   levocetirizine 5 MG tablet Commonly known as: XYZAL Take 1 tablet (5 mg total) by mouth every evening.   loratadine 10 MG tablet Commonly known as: Claritin Take 1 tablet (10 mg total) by mouth daily.   magnesium oxide 400 (240 Mg) MG tablet Commonly known as: MAG-OX Take 400 mg by mouth daily.   oxyCODONE 5 MG immediate release tablet Commonly known as: Oxy IR/ROXICODONE Take 1 tablet (5 mg total) by mouth every 6 (six) hours as needed for severe pain or breakthrough pain.   PRENATAL PO Take 2 tablets by mouth daily in the afternoon. 2 gummies a day   triamcinolone ointment 0.5 % Commonly known as: KENALOG Apply 1 application topically 2 (two) times daily as needed (skin irritation.).         Discharge home in stable condition Infant Feeding:  unsure Infant Disposition:home with mother Discharge instruction: per After Visit Summary and Postpartum booklet. Activity: Advance as tolerated. Pelvic rest for 6 weeks.  Diet: routine diet Anticipated Birth Control: Unsure Postpartum Appointment:1 week Additional Postpartum F/U:  n/a Future Appointments:No future appointments. Follow up Visit:  Follow-up Information     [facility]. Schedule an appointment as soon as possible for a visit in 1 week(s).   Specialty: Obstetrics and Gynecology Why: For Postpartum follow-up Contact information: [address] [place] [address] [phone]        [facility] Follow up in 6 week(s).   Specialty: Obstetrics and Gynecology Why: For Postpartum  follow-up Contact information: [address] [place] [address] [phone]                    03/03/2023 Purcell Nails, MD

## 2023-03-04 ENCOUNTER — Ambulatory Visit (HOSPITAL_COMMUNITY): Payer: Self-pay

## 2023-03-04 LAB — TYPE AND SCREEN
ABO/RH(D): O POS
Antibody Screen: NEGATIVE
Unit division: 0

## 2023-03-04 LAB — BPAM RBC
Blood Product Expiration Date: 202406072359
Blood Product Expiration Date: 202406122359
Unit Type and Rh: 5100
Unit Type and Rh: 5100

## 2023-03-04 NOTE — Lactation Note (Signed)
This note was copied from a baby's chart. Lactation Consultation Note  Patient Name: Karen Berg Date: 03/04/2023 Age:39 days Reason for consult: Follow-up assessment;Early term 37-38.6wks;Infant weight loss  LC in to room, parent is expecting baby's discharge. Infant is sleeping upon arrival. Birthing parent reports goof feedings and offering some expressed milk supplementation additionally, as needed. Birthing parent is collecting ~45 mL of expressed breast milk. LC reviewed pace bottle-feeding and volume guidelines for bottlefeeding. Infant's output has improved and last stools seems to be transitioning. Reinforced birthing parent rest, hydration and food intake.   Parent is comfortable with feeding plan and has no questions. Parent also contacted her insurance carrier to learn more about breastfeeding benefits. Encouraged to contact [facility] for support or questions.   Feeding Mother's Current Feeding Choice: Breast Milk  LATCH Score No latch observed during this encounter.   Lactation Tools Discussed/Used Tools: Pump Breast pump type: Double-Electric Breast Pump;Manual Pump Education: Setup, frequency, and cleaning;Milk Storage Reason for Pumping: ETI Pumping frequency: every 3h Pumped volume: 45 mL  Interventions Interventions: Breast feeding basics reviewed;Skin to skin;Hand express;Breast massage;Expressed milk;Hand pump;DEBP;Education;Pace feeding;LC Services brochure  Discharge Discharge Education: Engorgement and breast care;Warning signs for feeding baby Pump: DEBP;Manual;Personal  Consult Status Consult Status: Complete Date: 03/04/23 Follow-up type: Call as needed    Rovena Hearld [name] 03/04/2023, 9:23 AM

## 2023-03-07 ENCOUNTER — Ambulatory Visit: Payer: No Typology Code available for payment source

## 2023-03-09 ENCOUNTER — Telehealth (HOSPITAL_COMMUNITY): Payer: Self-pay | Admitting: *Deleted

## 2023-03-09 NOTE — Telephone Encounter (Signed)
Mom reports feeling good. Incision healing well per mom. No concerns regarding herself at this time. EPDSdeclined  (hospital score=7) Mom reports baby is well. Feeding, peeing, and pooping without difficulty. Reviewed safe sleep. Mom has no concerns about baby at present.  Duffy Rhody, RN 03-09-2023 at 3:22pm

## 2023-03-10 ENCOUNTER — Inpatient Hospital Stay (HOSPITAL_COMMUNITY): Admit: 2023-03-10 | Payer: No Typology Code available for payment source | Admitting: Obstetrics & Gynecology

## 2023-06-27 ENCOUNTER — Other Ambulatory Visit (HOSPITAL_COMMUNITY): Payer: Self-pay
